# Patient Record
Sex: Female | Born: 1990 | Hispanic: Yes | Marital: Married | Smoking: Never smoker
Health system: Southern US, Community
[De-identification: ages and names within clinical notes are randomized; demographics above are authoritative.]

## PROBLEM LIST (undated history)

## (undated) DIAGNOSIS — R519 Headache, unspecified: Secondary | ICD-10-CM

## (undated) DIAGNOSIS — O139 Gestational [pregnancy-induced] hypertension without significant proteinuria, unspecified trimester: Secondary | ICD-10-CM

## (undated) DIAGNOSIS — I959 Hypotension, unspecified: Secondary | ICD-10-CM

## (undated) DIAGNOSIS — N83209 Unspecified ovarian cyst, unspecified side: Secondary | ICD-10-CM

## (undated) DIAGNOSIS — F32A Depression, unspecified: Secondary | ICD-10-CM

## (undated) DIAGNOSIS — N390 Urinary tract infection, site not specified: Secondary | ICD-10-CM

## (undated) HISTORY — PX: NO PAST SURGERIES: SHX2092

---

## 2021-03-02 NOTE — L&D Delivery Note (Signed)
OB/[facility]N Faculty [facility] Delivery Note  Terri Kane is a 31 y.o. [id number] s/p VD at [redacted]w[redacted]d. She was admitted for SROM.   ROM: 5h 82m with clear fluid GBS Status: Positive Maximum Maternal Temperature: 98.6  Labor Progress: Patient presented in active labor and received ampicillin for GBS prophylaxis. 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 She progressed to complete after receiving epidural.  Delivery Date/Time: Sept 23, 2023 at 1702  Delivery: Called to room and patient was complete and pushing. Head delivered in ROA with restitution to LOT. No nuchal cord present. Shoulder and body delivered in usual fashion. Infant with spontaneous cry, placed on mother's abdomen, dried and stimulated. Cord clamped x 2 after 1-minute delay, and cut by FOB. Cord blood drawn. Placenta delivered spontaneously with gentle cord traction. Fundus firm with massage and Pitocin. Labia, perineum, vagina, and cervix inspected with hemostatic abrasion noted.  Placenta: Intact, Shultz, Disposal Complications: None Lacerations: Perineal Abrasion-Hemostatic EBL: 158 Analgesia: Epidural  Postpartum Planning -Message sent for PPV -Nexplanon Tray Ordered -SW Consult  Infant: Female-[name]  APGARs 9, 10  [id number] Cooke, [place] Terri Kane, CNM  11/22/2021 5:51 PM

## 2021-05-02 ENCOUNTER — Encounter (HOSPITAL_COMMUNITY): Payer: Self-pay | Admitting: *Deleted

## 2021-05-02 ENCOUNTER — Inpatient Hospital Stay (HOSPITAL_COMMUNITY)
Admission: AD | Admit: 2021-05-02 | Discharge: 2021-05-02 | Disposition: A | Discharge status: Home / Self Care | Payer: Self-pay | Attending: Obstetrics & Gynecology | Admitting: Obstetrics & Gynecology

## 2021-05-02 ENCOUNTER — Inpatient Hospital Stay (HOSPITAL_COMMUNITY): Payer: Self-pay

## 2021-05-02 DIAGNOSIS — N83201 Unspecified ovarian cyst, right side: Secondary | ICD-10-CM | POA: Insufficient documentation

## 2021-05-02 DIAGNOSIS — O3680X0 Pregnancy with inconclusive fetal viability, not applicable or unspecified: Secondary | ICD-10-CM | POA: Insufficient documentation

## 2021-05-02 DIAGNOSIS — O3481 Maternal care for other abnormalities of pelvic organs, first trimester: Secondary | ICD-10-CM | POA: Insufficient documentation

## 2021-05-02 DIAGNOSIS — Z3A08 8 weeks gestation of pregnancy: Secondary | ICD-10-CM | POA: Insufficient documentation

## 2021-05-02 DIAGNOSIS — O219 Vomiting of pregnancy, unspecified: Secondary | ICD-10-CM | POA: Insufficient documentation

## 2021-05-02 DIAGNOSIS — R109 Unspecified abdominal pain: Secondary | ICD-10-CM | POA: Insufficient documentation

## 2021-05-02 DIAGNOSIS — Z3490 Encounter for supervision of normal pregnancy, unspecified, unspecified trimester: Secondary | ICD-10-CM

## 2021-05-02 DIAGNOSIS — O26891 Other specified pregnancy related conditions, first trimester: Secondary | ICD-10-CM | POA: Insufficient documentation

## 2021-05-02 DIAGNOSIS — O26899 Other specified pregnancy related conditions, unspecified trimester: Secondary | ICD-10-CM

## 2021-05-02 DIAGNOSIS — Z6740 Type O blood, Rh positive: Secondary | ICD-10-CM | POA: Insufficient documentation

## 2021-05-02 HISTORY — DX: Hypotension, unspecified: I95.9

## 2021-05-02 LAB — URINALYSIS, ROUTINE W REFLEX MICROSCOPIC
Bilirubin Urine: NEGATIVE
Glucose, UA: NEGATIVE mg/dL
Ketones, ur: NEGATIVE mg/dL
Nitrite: NEGATIVE
Protein, ur: NEGATIVE mg/dL
Specific Gravity, Urine: 1.02 (ref 1.005–1.030)
pH: 7 (ref 5.0–8.0)

## 2021-05-02 LAB — COMPREHENSIVE METABOLIC PANEL
ALT: 19 U/L (ref 0–44)
AST: 15 U/L (ref 15–41)
Albumin: 3.8 g/dL (ref 3.5–5.0)
Alkaline Phosphatase: 41 U/L (ref 38–126)
Anion gap: 7 (ref 5–15)
BUN: 9 mg/dL (ref 6–20)
CO2: 23 mmol/L (ref 22–32)
Calcium: 8.9 mg/dL (ref 8.9–10.3)
Chloride: 106 mmol/L (ref 98–111)
Creatinine, Ser: 0.6 mg/dL (ref 0.44–1.00)
GFR, Estimated: >60 mL/min (ref >60)
Glucose, Bld: 81 mg/dL (ref 70–99)
Potassium: 4 mmol/L (ref 3.5–5.1)
Sodium: 136 mmol/L (ref 135–145)
Total Bilirubin: 0.3 mg/dL (ref 0.3–1.2)
Total Protein: 6.3 g/dL — ABNORMAL LOW (ref 6.5–8.1)

## 2021-05-02 LAB — WET PREP, GENITAL
Clue Cells Wet Prep HPF POC: NONE SEEN
Sperm: NONE SEEN
Trich, Wet Prep: NONE SEEN
WBC, Wet Prep HPF POC: >=10 — AB (ref <10)
Yeast Wet Prep HPF POC: NONE SEEN

## 2021-05-02 LAB — CBC
HCT: 36.7 % (ref 36.0–46.0)
Hemoglobin: 12.3 g/dL (ref 12.0–15.0)
MCH: 30.7 pg (ref 26.0–34.0)
MCHC: 33.5 g/dL (ref 30.0–36.0)
MCV: 91.5 fL (ref 80.0–100.0)
Platelets: 240 10*3/uL (ref 150–400)
RBC: 4.01 MIL/uL (ref 3.87–5.11)
RDW: 11.8 % (ref 11.5–15.5)
WBC: 8.1 10*3/uL (ref 4.0–10.5)
nRBC: 0 % (ref 0.0–0.2)

## 2021-05-02 LAB — ABO/RH: ABO/RH(D): O POS

## 2021-05-02 LAB — PREGNANCY, URINE: Preg Test, Ur: POSITIVE

## 2021-05-02 LAB — HCG, QUANTITATIVE, PREGNANCY: hCG, Beta Chain, Quant, S: 325320 m[IU]/mL — ABNORMAL HIGH (ref <5)

## 2021-05-02 MED ORDER — ONDANSETRON HCL 4 MG/2ML IJ SOLN
4.0000 mg | Freq: Once | INTRAMUSCULAR | Status: AC
  Administered 2021-05-02: 4 mg via INTRAVENOUS
  Filled 2021-05-02: qty 2

## 2021-05-02 MED ORDER — LACTATED RINGERS IV BOLUS
1000.0000 mL | Freq: Once | INTRAVENOUS | Status: AC
  Administered 2021-05-02: 1000 mL via INTRAVENOUS

## 2021-05-02 MED ORDER — PROMETHAZINE HCL 12.5 MG PO TABS
12.5000 mg | ORAL_TABLET | Freq: Four times a day (QID) | ORAL | 0 refills | Status: DC | PRN
Start: 2021-05-02 — End: 2021-05-05

## 2021-05-02 NOTE — Discharge Instructions (Signed)

## 2021-05-02 NOTE — MAU Note (Signed)
.  Terri Kane is a 31 y.o. at Unknown here in MAU reporting: that she has lower abd pain that gets worse at night . Pt has had n/v also off and on x 3 week. Able to keep some food down but most everything comes back up . Denies any vag bleeding or discharge.  ?LMP: 03/07/2021. Pregnancy confirmation from [facility] ?Onset of complaint: 3 weeks ago. ?Pain score: 10 ?BP (!) 107/53   Pulse 89   Temp 98.5 ?F (36.9 ?C)   Resp 18   Ht 5' (1.524 m)   Wt 41.6 kg   LMP 03/07/2021   BMI 17.93 kg/m?  ? ?FHT: ?Lab orders placed from triage:U/A    ?

## 2021-05-02 NOTE — MAU Provider Note (Signed)
?History  ?  ? ?CSN: [id number] ? ?Arrival date and time: 05/02/21 1128 ? ? Event Date/Time  ? First Provider Initiated Contact with Patient 05/02/21 1338   ?  ? ?Chief Complaint  ?Patient presents with  ? Abdominal Pain  ? ?HPI ?Leighanne Inaya Kight is a 31 y.o. [id number] at [redacted]w[redacted]d who presents to MAU with chief complaint of abdominal pain. This is a new problem, onset three weeks ago. Patient's pain is bilateral, located in her lower abdomen. Pain score is 10/10. She denies aggravating or alleviating factors. She has not taken medication or tried other treatments for this complaint.  ? ?Patient also reports nausea and vomiting. These are recurrent problems, onset three weeks ago. She is unable to tolerate anything PO. She denies weakness, dizziness, syncope. She denies vaginal bleeding, dysuria, fever or recent illness. ? ?Patient plans to pursue prenatal care at [facility]. ? ?OB History   ? ? Gravida  ?4  ? Para  ?2  ? Term  ?2  ? Preterm  ?   ? AB  ?1  ? Living  ?2  ?  ? ? SAB  ?1  ? IAB  ?   ? Ectopic  ?   ? Multiple  ?   ? Live Births  ?2  ?   ?  ?  ? ? ?Past Medical History:  ?Diagnosis Date  ? Hypotension   ? ? ?Past Surgical History:  ?Procedure Laterality Date  ? NO PAST SURGERIES    ? ? ?No family history on file. ? ?Social History  ? ?Tobacco Use  ? Smoking status: Former  ?  Types: Cigarettes  ? Smokeless tobacco: Never  ?Vaping Use  ? Vaping Use: Never used  ?Substance Use Topics  ? Alcohol use: Not Currently  ? Drug use: Never  ? ? ?Allergies: No Known Allergies ? ?No medications prior to admission.  ? ? ?Review of Systems  ?Gastrointestinal:  Positive for abdominal pain, nausea and vomiting.  ?All other systems reviewed and are negative. ?Physical Exam  ? ?Blood pressure (!) 98/56, pulse 66, temperature 98.5 ?F (36.9 ?C), resp. rate 16, height 5' (1.524 m), weight 41.6 kg, last menstrual period 03/07/2021, SpO2 100 %. ? ?Physical Exam ?Vitals and nursing note reviewed. Exam conducted with a chaperone  present.  ?Constitutional:   ?   Appearance: She is well-developed.  ?Cardiovascular:  ?   Rate and Rhythm: Normal rate and regular rhythm.  ?   Heart sounds: Normal heart sounds.  ?Pulmonary:  ?   Effort: Pulmonary effort is normal.  ?   Breath sounds: Normal breath sounds.  ?Abdominal:  ?   General: Abdomen is flat. Bowel sounds are normal.  ?   Palpations: Abdomen is soft.  ?   Tenderness: There is no abdominal tenderness. There is no right CVA tenderness or left CVA tenderness.  ?Neurological:  ?   Mental Status: She is alert and oriented to person, place, and time.  ?Psychiatric:     ?   Mood and Affect: Mood normal.     ?   Behavior: Behavior normal.  ? ? ?MAU Course  ?Procedures ? ?MDM ?Orders Placed This Encounter  ?Procedures  ? Wet prep, genital  ? US OB LESS THAN 14 WEEKS WITH OB TRANSVAGINAL  ? Urinalysis, Routine w reflex microscopic Urine, Clean Catch  ? CBC  ? hCG, quantitative, pregnancy  ? Comprehensive metabolic panel  ? Nursing communication  ? ABO/Rh  ? Insert peripheral IV  ?  Discharge patient  ? ?Patient Vitals for the past 24 hrs: ? BP Temp Pulse Resp SpO2 Height Weight  ?05/02/21 1356 -- -- -- 16 100 % -- --  ?05/02/21 1352 (!) 98/56 -- 66 -- -- -- --  ?05/02/21 1151 (!) 107/53 98.5 ?F (36.9 ?C) 89 18 -- 5' (1.524 m) 41.6 kg  ? ?Results for orders placed or performed during the hospital encounter of 05/02/21 (from the past 24 hour(s))  ?CBC     Status: None  ? Collection Time: 05/02/21 12:08 PM  ?Result Value Ref Range  ? WBC 8.1 4.0 - 10.5 K/uL  ? RBC 4.01 3.87 - 5.11 MIL/uL  ? Hemoglobin 12.3 12.0 - 15.0 g/dL  ? HCT 36.7 36.0 - 46.0 %  ? MCV 91.5 80.0 - 100.0 fL  ? MCH 30.7 26.0 - 34.0 pg  ? MCHC 33.5 30.0 - 36.0 g/dL  ? RDW 11.8 11.5 - 15.5 %  ? Platelets 240 150 - 400 K/uL  ? nRBC 0.0 0.0 - 0.2 %  ?Comprehensive metabolic panel     Status: Abnormal  ? Collection Time: 05/02/21 12:08 PM  ?Result Value Ref Range  ? Sodium 136 135 - 145 mmol/L  ? Potassium 4.0 3.5 - 5.1 mmol/L  ? Chloride  106 98 - 111 mmol/L  ? CO2 23 22 - 32 mmol/L  ? Glucose, Bld 81 70 - 99 mg/dL  ? BUN 9 6 - 20 mg/dL  ? Creatinine, Ser 0.60 0.44 - 1.00 mg/dL  ? Calcium 8.9 8.9 - 10.3 mg/dL  ? Total Protein 6.3 (L) 6.5 - 8.1 g/dL  ? Albumin 3.8 3.5 - 5.0 g/dL  ? AST 15 15 - 41 U/L  ? ALT 19 0 - 44 U/L  ? Alkaline Phosphatase 41 38 - 126 U/L  ? Total Bilirubin 0.3 0.3 - 1.2 mg/dL  ? GFR, Estimated >60 >60 mL/min  ? Anion gap 7 5 - 15  ?ABO/Rh     Status: None  ? Collection Time: 05/02/21 12:08 PM  ?Result Value Ref Range  ? ABO/RH(D) O POS   ? No rh immune globuloin    ?  NOT A RH IMMUNE GLOBULIN CANDIDATE, PT RH POSITIVE ?Performed at [facility], [address] [place], [place] [address] ?  ?Wet prep, genital     Status: Abnormal  ? Collection Time: 05/02/21 12:08 PM  ? Specimen: Vaginal  ?Result Value Ref Range  ? Yeast Wet Prep HPF POC NONE SEEN NONE SEEN  ? Trich, Wet Prep NONE SEEN NONE SEEN  ? Clue Cells Wet Prep HPF POC NONE SEEN NONE SEEN  ? WBC, Wet Prep HPF POC >=10 (A) <10  ? Sperm NONE SEEN   ?Urinalysis, Routine w reflex microscopic Urine, Clean Catch     Status: Abnormal  ? Collection Time: 05/02/21 12:25 PM  ?Result Value Ref Range  ? Color, Urine YELLOW YELLOW  ? APPearance CLOUDY (A) CLEAR  ? Specific Gravity, Urine 1.020 1.005 - 1.030  ? pH 7.0 5.0 - 8.0  ? Glucose, UA NEGATIVE NEGATIVE mg/dL  ? Hgb urine dipstick SMALL (A) NEGATIVE  ? Bilirubin Urine NEGATIVE NEGATIVE  ? Ketones, ur NEGATIVE NEGATIVE mg/dL  ? Protein, ur NEGATIVE NEGATIVE mg/dL  ? Nitrite NEGATIVE NEGATIVE  ? Leukocytes,Ua TRACE (A) NEGATIVE  ? RBC / HPF 6-10 0 - 5 RBC/hpf  ? WBC, UA 0-5 0 - 5 WBC/hpf  ? Bacteria, UA FEW (A) NONE SEEN  ? Squamous Epithelial /  LPF 0-5 0 - 5  ? Mucus PRESENT   ? Amorphous Crystal PRESENT   ? ?US OB LESS THAN 14 WEEKS WITH OB TRANSVAGINAL ? ?Result Date: 05/02/2021 ?CLINICAL DATA:  31 year old pregnant female presents with cramping. Quantitative beta HCG pending. EDC by LMP: 12/12/2021, projecting to an  expected gestational age of [redacted] weeks 0 days. EXAM: OBSTETRIC <14 WK [place] AND TRANSVAGINAL OB US TECHNIQUE: Both transabdominal and transvaginal ultrasound examinations were performed for complete evaluation of the gestation as well as the maternal uterus, adnexal regions, and pelvic cul-de-sac. Transvaginal technique was performed to assess early pregnancy. COMPARISON:  None. FINDINGS: Intrauterine gestational sac: Single Yolk sac:  Visualized. Embryo:  Visualized. Cardiac Activity: Visualized. Heart Rate: 173 bpm CRL:  20.7 mm   8 w   4 d                  [place] EDC: 12/08/2021 Subchorionic hemorrhage:  No convincing perigestational bleed. Maternal uterus/adnexae: Anteverted uterus. No uterine fibroids. Right ovary measures 4.2 x 2.6 x 3.5 cm and contains a corpus luteum and a separate 2.9 x 2.0 x 2.1 cm cyst with no internal vascularity and mild heterogeneous internal echoes, compatible with a hemorrhagic cyst. Left ovary measures 1.5 x 1.2 x 1.9 cm. No suspicious ovarian or adnexal masses. No abnormal free fluid in the pelvis. IMPRESSION: 1. Single living intrauterine gestation at 8 weeks 4 days by crown-rump length, concordant with provided menstrual dating. 2. No acute first-trimester gestational abnormality. 3. Right ovarian 2.9 cm hemorrhagic cyst. Electronically Signed   By: Delbert Phenix M.D.   On: 05/02/2021 13:34   ? ?Meds ordered this encounter  ?Medications  ? lactated ringers bolus 1,000 mL  ? ondansetron (ZOFRAN) injection 4 mg  ? promethazine (PHENERGAN) 12.5 MG tablet  ?  Sig: Take 1 tablet (12.5 mg total) by mouth every 6 (six) hours as needed for nausea or vomiting.  ?  Dispense:  30 tablet  ?  Refill:  0  ?  Order Specific Question:   Supervising Provider  ?  AnswerMyna Hidalgo [[id number]]  ? ?Assessment and Plan  ?--31 y.o. [id number] at [redacted]w[redacted]d with confirmed IUP measuring 8w 4d ?--Vomiting without ketonuria ?--No episodes of vomiting during evaluation in MAU ?--Tolerating PO prior to  discharge ?--Discharge home in stable condition ? ?F/U ?--Patient to schedule care with [facility] ? ?Calvert Cantor, MSA, MSN, CNM ?05/02/2021, 2:12 PM  ?

## 2021-05-04 LAB — CULTURE, OB URINE: Culture: 1000 — AB

## 2021-05-05 ENCOUNTER — Inpatient Hospital Stay (HOSPITAL_COMMUNITY)
Admission: AD | Admit: 2021-05-05 | Discharge: 2021-05-05 | Disposition: A | Discharge status: Home / Self Care | Payer: Self-pay | Attending: Family Medicine | Admitting: Family Medicine

## 2021-05-05 ENCOUNTER — Encounter: Payer: Self-pay | Admitting: Family Medicine

## 2021-05-05 ENCOUNTER — Encounter (HOSPITAL_COMMUNITY): Payer: Self-pay | Admitting: Family Medicine

## 2021-05-05 ENCOUNTER — Other Ambulatory Visit: Payer: Self-pay

## 2021-05-05 DIAGNOSIS — R1032 Left lower quadrant pain: Secondary | ICD-10-CM | POA: Insufficient documentation

## 2021-05-05 DIAGNOSIS — O26893 Other specified pregnancy related conditions, third trimester: Secondary | ICD-10-CM | POA: Insufficient documentation

## 2021-05-05 DIAGNOSIS — Z3A08 8 weeks gestation of pregnancy: Secondary | ICD-10-CM

## 2021-05-05 DIAGNOSIS — K920 Hematemesis: Secondary | ICD-10-CM | POA: Insufficient documentation

## 2021-05-05 DIAGNOSIS — O212 Late vomiting of pregnancy: Secondary | ICD-10-CM | POA: Insufficient documentation

## 2021-05-05 DIAGNOSIS — Z3A38 38 weeks gestation of pregnancy: Secondary | ICD-10-CM | POA: Insufficient documentation

## 2021-05-05 DIAGNOSIS — O99891 Other specified diseases and conditions complicating pregnancy: Secondary | ICD-10-CM | POA: Insufficient documentation

## 2021-05-05 DIAGNOSIS — R8271 Bacteriuria: Secondary | ICD-10-CM | POA: Insufficient documentation

## 2021-05-05 DIAGNOSIS — R112 Nausea with vomiting, unspecified: Secondary | ICD-10-CM

## 2021-05-05 LAB — GC/CHLAMYDIA PROBE AMP (~~LOC~~) NOT AT ARMC
Chlamydia: NEGATIVE
Comment: NEGATIVE
Comment: NORMAL
Neisseria Gonorrhea: NEGATIVE

## 2021-05-05 LAB — URINALYSIS, ROUTINE W REFLEX MICROSCOPIC
Bilirubin Urine: NEGATIVE
Glucose, UA: NEGATIVE mg/dL
Hgb urine dipstick: NEGATIVE
Ketones, ur: NEGATIVE mg/dL
Leukocytes,Ua: NEGATIVE
Nitrite: NEGATIVE
Protein, ur: NEGATIVE mg/dL
Specific Gravity, Urine: 1.021 (ref 1.005–1.030)
pH: 9 — ABNORMAL HIGH (ref 5.0–8.0)

## 2021-05-05 LAB — BASIC METABOLIC PANEL
Anion gap: 8 (ref 5–15)
BUN: 13 mg/dL (ref 6–20)
CO2: 24 mmol/L (ref 22–32)
Calcium: 8.9 mg/dL (ref 8.9–10.3)
Chloride: 103 mmol/L (ref 98–111)
Creatinine, Ser: 0.7 mg/dL (ref 0.44–1.00)
GFR, Estimated: >60 mL/min (ref >60)
Glucose, Bld: 93 mg/dL (ref 70–99)
Potassium: 3.8 mmol/L (ref 3.5–5.1)
Sodium: 135 mmol/L (ref 135–145)

## 2021-05-05 MED ORDER — FAMOTIDINE 20 MG PO TABS: 20.0000 mg | ORAL_TABLET | Freq: Two times a day (BID) | ORAL | 3 refills | Status: DC

## 2021-05-05 MED ORDER — PROMETHAZINE HCL 12.5 MG PO TABS: 12.5000 mg | ORAL_TABLET | Freq: Four times a day (QID) | ORAL | 3 refills | Status: DC | PRN

## 2021-05-05 MED ORDER — LACTATED RINGERS IV BOLUS
1000.0000 mL | Freq: Once | INTRAVENOUS | Status: AC
  Administered 2021-05-05: 1000 mL via INTRAVENOUS

## 2021-05-05 MED ORDER — FAMOTIDINE IN NACL 20-0.9 MG/50ML-% IV SOLN
20.0000 mg | Freq: Once | INTRAVENOUS | Status: AC
  Administered 2021-05-05: 20 mg via INTRAVENOUS
  Filled 2021-05-05: qty 50

## 2021-05-05 MED ORDER — ONDANSETRON HCL 4 MG/2ML IJ SOLN
4.0000 mg | Freq: Once | INTRAMUSCULAR | Status: AC
  Administered 2021-05-05: 4 mg via INTRAVENOUS
  Filled 2021-05-05: qty 2

## 2021-05-05 NOTE — MAU Note (Signed)
Terri Kane is a 31 y.o. at [redacted]w[redacted]d here in MAU reporting: abdominal pain and vomiting blood.   ? ?LMP: 03/07/2021 ? ?Onset of complaint: abdominal pain 3 weeks & vomiting blood began last night ? ?Pain score: 10 ?   ? ?Lab orders placed from triage:   U/A ?

## 2021-05-05 NOTE — MAU Provider Note (Signed)
?History  ?  ? ?CSN: [id number] ? ?Arrival date and time: 05/05/21 1102 ? ? ? ?Chief Complaint  ?Patient presents with  ? Abdominal Pain  ? Vomiting Blood  ? ?HPI ?This is a 31 year old G4 P2-0-1-2 at 38 weeks and 3 days who presents with left-sided abdominal pain, nausea, vomiting.  Emesis occurs with oral food and liquid intake.  Emesis described as stomach contents with blood.  She was evaluated last week and diagnosed with an IUP.  She was given IV fluids and an antiemetic.  Records do show that she was prescribed an antiemetic, but she did not pick up the medication.  No palliating or provoking factors. ? ?OB History   ? ? Gravida  ?4  ? Para  ?2  ? Term  ?2  ? Preterm  ?   ? AB  ?1  ? Living  ?2  ?  ? ? SAB  ?1  ? IAB  ?   ? Ectopic  ?   ? Multiple  ?   ? Live Births  ?2  ?   ?  ?  ? ? ?Past Medical History:  ?Diagnosis Date  ? Hypotension   ? ? ?Past Surgical History:  ?Procedure Laterality Date  ? NO PAST SURGERIES    ? ? ?No family history on file. ? ?Social History  ? ?Tobacco Use  ? Smoking status: Former  ?  Types: Cigarettes  ? Smokeless tobacco: Never  ?Vaping Use  ? Vaping Use: Never used  ?Substance Use Topics  ? Alcohol use: Not Currently  ? Drug use: Never  ? ? ?Allergies: No Known Allergies ? ?Medications Prior to Admission  ?Medication Sig Dispense Refill Last Dose  ? promethazine (PHENERGAN) 12.5 MG tablet Take 1 tablet (12.5 mg total) by mouth every 6 (six) hours as needed for nausea or vomiting. 30 tablet 0   ? ? ?Review of Systems ?Physical Exam  ? ?Blood pressure 103/65, pulse 96, temperature 98.2 ?F (36.8 ?C), temperature source Oral, resp. rate 18, height 5' (1.524 m), weight 41.9 kg, last menstrual period 03/07/2021, SpO2 98 %. ? ?Physical Exam ?Vitals reviewed.  ?Constitutional:   ?   Appearance: She is well-developed.  ?HENT:  ?   Head: Normocephalic and atraumatic.  ?Cardiovascular:  ?   Rate and Rhythm: Normal rate.  ?Pulmonary:  ?   Effort: Pulmonary effort is normal.  ?Abdominal:  ?    General: Abdomen is flat.  ?   Palpations: Abdomen is soft.  ?   Tenderness: There is abdominal tenderness in the left lower quadrant. There is no guarding or rebound.  ?Skin: ?   General: Skin is warm and dry.  ?   Capillary Refill: Capillary refill takes less than 2 seconds.  ?Neurological:  ?   General: No focal deficit present.  ?   Mental Status: She is alert and oriented to person, place, and time.  ? ?Results for orders placed or performed during the hospital encounter of 05/05/21 (from the past 24 hour(s))  ?Urinalysis, Routine w reflex microscopic Urine, Clean Catch     Status: Abnormal  ? Collection Time: 05/05/21 12:42 PM  ?Result Value Ref Range  ? Color, Urine AMBER (A) YELLOW  ? APPearance TURBID (A) CLEAR  ? Specific Gravity, Urine 1.021 1.005 - 1.030  ? pH 9.0 (H) 5.0 - 8.0  ? Glucose, UA NEGATIVE NEGATIVE mg/dL  ? Hgb urine dipstick NEGATIVE NEGATIVE  ? Bilirubin Urine NEGATIVE NEGATIVE  ? Ketones, ur  NEGATIVE NEGATIVE mg/dL  ? Protein, ur NEGATIVE NEGATIVE mg/dL  ? Nitrite NEGATIVE NEGATIVE  ? Leukocytes,Ua NEGATIVE NEGATIVE  ? RBC / HPF 0-5 0 - 5 RBC/hpf  ? WBC, UA 6-10 0 - 5 WBC/hpf  ? Bacteria, UA RARE (A) NONE SEEN  ? Squamous Epithelial / LPF 0-5 0 - 5  ?Basic metabolic panel     Status: None  ? Collection Time: 05/05/21  1:49 PM  ?Result Value Ref Range  ? Sodium 135 135 - 145 mmol/L  ? Potassium 3.8 3.5 - 5.1 mmol/L  ? Chloride 103 98 - 111 mmol/L  ? CO2 24 22 - 32 mmol/L  ? Glucose, Bld 93 70 - 99 mg/dL  ? BUN 13 6 - 20 mg/dL  ? Creatinine, Ser 0.70 0.44 - 1.00 mg/dL  ? Calcium 8.9 8.9 - 10.3 mg/dL  ? GFR, Estimated >60 >60 mL/min  ? Anion gap 8 5 - 15  ? ? ? ?MAU Course  ?Procedures ? ?MDM ?IVF given with zofran and pepcid. Patient improved. Tolerated PO liquid ? ?Assessment and Plan  ? ?1. [redacted] weeks gestation of pregnancy   ?2. Nausea and vomiting, unspecified vomiting type   ? ?Discharge to home with Pepcid and phenergan. Return precautions given. ? ?Levie Heritage ?05/05/2021, 1:42 PM  ?

## 2021-05-13 ENCOUNTER — Other Ambulatory Visit: Payer: Self-pay

## 2021-05-13 ENCOUNTER — Inpatient Hospital Stay (HOSPITAL_COMMUNITY)
Admission: AD | Admit: 2021-05-13 | Discharge: 2021-05-13 | Disposition: A | Discharge status: Home / Self Care | Payer: Self-pay | Attending: Obstetrics and Gynecology | Admitting: Obstetrics and Gynecology

## 2021-05-13 ENCOUNTER — Encounter (HOSPITAL_COMMUNITY): Payer: Self-pay | Admitting: Obstetrics and Gynecology

## 2021-05-13 DIAGNOSIS — Z3A09 9 weeks gestation of pregnancy: Secondary | ICD-10-CM

## 2021-05-13 DIAGNOSIS — R12 Heartburn: Secondary | ICD-10-CM

## 2021-05-13 DIAGNOSIS — O26891 Other specified pregnancy related conditions, first trimester: Secondary | ICD-10-CM

## 2021-05-13 DIAGNOSIS — O219 Vomiting of pregnancy, unspecified: Secondary | ICD-10-CM

## 2021-05-13 LAB — COMPREHENSIVE METABOLIC PANEL
ALT: 21 U/L (ref 0–44)
AST: 16 U/L (ref 15–41)
Albumin: 4 g/dL (ref 3.5–5.0)
Alkaline Phosphatase: 45 U/L (ref 38–126)
Anion gap: 9 (ref 5–15)
BUN: 12 mg/dL (ref 6–20)
CO2: 24 mmol/L (ref 22–32)
Calcium: 9.2 mg/dL (ref 8.9–10.3)
Chloride: 103 mmol/L (ref 98–111)
Creatinine, Ser: 0.63 mg/dL (ref 0.44–1.00)
GFR, Estimated: >60 mL/min (ref >60)
Glucose, Bld: 94 mg/dL (ref 70–99)
Potassium: 3.6 mmol/L (ref 3.5–5.1)
Sodium: 136 mmol/L (ref 135–145)
Total Bilirubin: 0.6 mg/dL (ref 0.3–1.2)
Total Protein: 7.3 g/dL (ref 6.5–8.1)

## 2021-05-13 LAB — URINALYSIS, ROUTINE W REFLEX MICROSCOPIC
Bilirubin Urine: NEGATIVE
Glucose, UA: NEGATIVE mg/dL
Ketones, ur: 20 mg/dL — AB
Nitrite: NEGATIVE
Protein, ur: NEGATIVE mg/dL
Specific Gravity, Urine: 1.021 (ref 1.005–1.030)
pH: 7 (ref 5.0–8.0)

## 2021-05-13 LAB — CBC WITH DIFFERENTIAL/PLATELET
Abs Immature Granulocytes: 0.03 10*3/uL (ref 0.00–0.07)
Basophils Absolute: 0 10*3/uL (ref 0.0–0.1)
Basophils Relative: 0 %
Eosinophils Absolute: 0.1 10*3/uL (ref 0.0–0.5)
Eosinophils Relative: 1 %
HCT: 38.5 % (ref 36.0–46.0)
Hemoglobin: 13.5 g/dL (ref 12.0–15.0)
Immature Granulocytes: 0 %
Lymphocytes Relative: 26 %
Lymphs Abs: 2.5 10*3/uL (ref 0.7–4.0)
MCH: 31.2 pg (ref 26.0–34.0)
MCHC: 35.1 g/dL (ref 30.0–36.0)
MCV: 88.9 fL (ref 80.0–100.0)
Monocytes Absolute: 0.4 10*3/uL (ref 0.1–1.0)
Monocytes Relative: 5 %
Neutro Abs: 6.4 10*3/uL (ref 1.7–7.7)
Neutrophils Relative %: 68 %
Platelets: 312 10*3/uL (ref 150–400)
RBC: 4.33 MIL/uL (ref 3.87–5.11)
RDW: 11.6 % (ref 11.5–15.5)
WBC: 9.5 10*3/uL (ref 4.0–10.5)
nRBC: 0 % (ref 0.0–0.2)

## 2021-05-13 LAB — AMYLASE: Amylase: 129 U/L — ABNORMAL HIGH (ref 28–100)

## 2021-05-13 LAB — LIPASE, BLOOD: Lipase: 34 U/L (ref 11–51)

## 2021-05-13 MED ORDER — PROMETHAZINE HCL 25 MG PO TABS: 25.0000 mg | ORAL_TABLET | Freq: Four times a day (QID) | ORAL | 1 refills | Status: DC | PRN

## 2021-05-13 MED ORDER — PROMETHAZINE HCL 25 MG/ML IJ SOLN
25.0000 mg | Freq: Once | INTRAVENOUS | Status: AC
  Administered 2021-05-13: 25 mg via INTRAVENOUS
  Filled 2021-05-13: qty 1

## 2021-05-13 MED ORDER — FAMOTIDINE IN NACL 20-0.9 MG/50ML-% IV SOLN
20.0000 mg | Freq: Once | INTRAVENOUS | Status: AC
  Administered 2021-05-13: 20 mg via INTRAVENOUS
  Filled 2021-05-13: qty 50

## 2021-05-13 NOTE — MAU Note (Signed)
...  Terri Kane is a 31 y.o. at [redacted]w[redacted]d here in MAU reporting: N/V throughout her entire pregnancy. She states she has also been experiencing upper abdominal pain and bilateral back pain for three weeks now. No VB or LOF.  ? ?Unable to keep any medications down. Last took at 0900 this morning prior to eating. ? ?Last PO: ?Last attempt at eating this morning at 1000 ?Last drink at 1600 ? ?Pain score:  ?10/10 upper abdomen ?10/10 lower back ? ?Lab orders placed from triage: UA ? ?

## 2021-05-13 NOTE — MAU Provider Note (Signed)
?History  ?  ? ?[id number] ? ?Arrival date and time: 05/13/21 1519 ?  ? ?Chief Complaint  ?Patient presents with  ? Nausea  ? Abdominal Pain  ? Back Pain  ? Emesis  ? ? ? ?HPI ?Terri Kane is a 32 y.o. at [redacted]w[redacted]d by LMP who presents for abdominal pain, heartburn, & nausea/vomiting. ?Reports vomiting daily since last week. States she has vomited countless times today and is unable to keep anything down. Was prescribed pepcid & phenergan but is only aware of the phenergan which she last took this morning at 9 am. Reports some lower abdominal cramping. Denies fever, diarrhea, or vaginal bleeding.   ? ?OB History   ? ? Gravida  ?4  ? Para  ?2  ? Term  ?2  ? Preterm  ?   ? AB  ?1  ? Living  ?2  ?  ? ? SAB  ?1  ? IAB  ?   ? Ectopic  ?   ? Multiple  ?   ? Live Births  ?2  ?   ?  ?  ? ? ?Past Medical History:  ?Diagnosis Date  ? Hypotension   ? ? ?Past Surgical History:  ?Procedure Laterality Date  ? NO PAST SURGERIES    ? ? ?History reviewed. No pertinent family history. ? ?No Known Allergies ? ?No current facility-administered medications on file prior to encounter.  ? ?Current Outpatient Medications on File Prior to Encounter  ?Medication Sig Dispense Refill  ? famotidine (PEPCID) 20 MG tablet Take 1 tablet (20 mg total) by mouth 2 (two) times daily. 60 tablet 3  ? promethazine (PHENERGAN) 12.5 MG tablet Take 1 tablet (12.5 mg total) by mouth every 6 (six) hours as needed for nausea or vomiting. 30 tablet 3  ? ? ? ?ROS ?Pertinent positives and negative per HPI, all others reviewed and negative ? ?Physical Exam  ? ?BP 94/68 (BP Location: Left Arm)   Pulse 81   Temp 98.1 ?F (36.7 ?C) (Oral)   Resp 14   Ht 5' (1.524 m)   Wt 40.8 kg   LMP 03/07/2021   SpO2 100%   BMI 17.58 kg/m?  ? ?Patient Vitals for the past 24 hrs: ? BP Temp Temp src Pulse Resp SpO2 Height Weight  ?05/13/21 2258 94/68 -- -- 81 -- -- -- --  ?05/13/21 2228 (!) 95/57 -- -- 75 -- -- -- --  ?05/13/21 1647 103/63 98.1 ?F (36.7 ?C) Oral 83 14  100 % 5' (1.524 m) 40.8 kg  ? ? ?Physical Exam ?Vitals and nursing note reviewed.  ?Constitutional:   ?   General: She is not in acute distress. ?   Appearance: She is well-developed. She is not ill-appearing.  ?HENT:  ?   Head: Normocephalic and atraumatic.  ?Pulmonary:  ?   Effort: Pulmonary effort is normal. No respiratory distress.  ?Abdominal:  ?   General: Abdomen is flat.  ?   Palpations: Abdomen is soft.  ?   Tenderness: There is no abdominal tenderness.  ?Skin: ?   General: Skin is warm and dry.  ?Neurological:  ?   Mental Status: She is alert.  ?  ?Bedside Ultrasound ?Pt informed that the ultrasound is considered a limited OB ultrasound and is not intended to be a complete ultrasound exam.  Patient also informed that the ultrasound is not being completed with the intent of assessing for fetal or placental anomalies or any pelvic abnormalities.  Explained that the  purpose of today?s ultrasound is to assess for  viability.  Patient acknowledges the purpose of the exam and the limitations of the study.   ? ? ?My interpretation: active IUP with fetal heart rate 176 bpm ? ?Labs ?Results for orders placed or performed during the hospital encounter of 05/13/21 (from the past 24 hour(s))  ?CBC with Differential/Platelet     Status: None  ? Collection Time: 05/13/21  5:33 PM  ?Result Value Ref Range  ? WBC 9.5 4.0 - 10.5 K/uL  ? RBC 4.33 3.87 - 5.11 MIL/uL  ? Hemoglobin 13.5 12.0 - 15.0 g/dL  ? HCT 38.5 36.0 - 46.0 %  ? MCV 88.9 80.0 - 100.0 fL  ? MCH 31.2 26.0 - 34.0 pg  ? MCHC 35.1 30.0 - 36.0 g/dL  ? RDW 11.6 11.5 - 15.5 %  ? Platelets 312 150 - 400 K/uL  ? nRBC 0.0 0.0 - 0.2 %  ? Neutrophils Relative % 68 %  ? Neutro Abs 6.4 1.7 - 7.7 K/uL  ? Lymphocytes Relative 26 %  ? Lymphs Abs 2.5 0.7 - 4.0 K/uL  ? Monocytes Relative 5 %  ? Monocytes Absolute 0.4 0.1 - 1.0 K/uL  ? Eosinophils Relative 1 %  ? Eosinophils Absolute 0.1 0.0 - 0.5 K/uL  ? Basophils Relative 0 %  ? Basophils Absolute 0.0 0.0 - 0.1 K/uL  ?  Immature Granulocytes 0 %  ? Abs Immature Granulocytes 0.03 0.00 - 0.07 K/uL  ?Comprehensive metabolic panel     Status: None  ? Collection Time: 05/13/21  5:33 PM  ?Result Value Ref Range  ? Sodium 136 135 - 145 mmol/L  ? Potassium 3.6 3.5 - 5.1 mmol/L  ? Chloride 103 98 - 111 mmol/L  ? CO2 24 22 - 32 mmol/L  ? Glucose, Bld 94 70 - 99 mg/dL  ? BUN 12 6 - 20 mg/dL  ? Creatinine, Ser 0.63 0.44 - 1.00 mg/dL  ? Calcium 9.2 8.9 - 10.3 mg/dL  ? Total Protein 7.3 6.5 - 8.1 g/dL  ? Albumin 4.0 3.5 - 5.0 g/dL  ? AST 16 15 - 41 U/L  ? ALT 21 0 - 44 U/L  ? Alkaline Phosphatase 45 38 - 126 U/L  ? Total Bilirubin 0.6 0.3 - 1.2 mg/dL  ? GFR, Estimated >60 >60 mL/min  ? Anion gap 9 5 - 15  ?Amylase     Status: Abnormal  ? Collection Time: 05/13/21  5:33 PM  ?Result Value Ref Range  ? Amylase 129 (H) 28 - 100 U/L  ?Lipase, blood     Status: None  ? Collection Time: 05/13/21  5:33 PM  ?Result Value Ref Range  ? Lipase 34 11 - 51 U/L  ?Urinalysis, Routine w reflex microscopic Urine, Clean Catch     Status: Abnormal  ? Collection Time: 05/13/21  6:07 PM  ?Result Value Ref Range  ? Color, Urine YELLOW YELLOW  ? APPearance CLOUDY (A) CLEAR  ? Specific Gravity, Urine 1.021 1.005 - 1.030  ? pH 7.0 5.0 - 8.0  ? Glucose, UA NEGATIVE NEGATIVE mg/dL  ? Hgb urine dipstick SMALL (A) NEGATIVE  ? Bilirubin Urine NEGATIVE NEGATIVE  ? Ketones, ur 20 (A) NEGATIVE mg/dL  ? Protein, ur NEGATIVE NEGATIVE mg/dL  ? Nitrite NEGATIVE NEGATIVE  ? Leukocytes,Ua LARGE (A) NEGATIVE  ? RBC / HPF 6-10 0 - 5 RBC/hpf  ? Bacteria, UA FEW (A) NONE SEEN  ? Squamous Epithelial / LPF 11-20 0 - 5  ?  Amorphous Crystal PRESENT   ? ? ?Imaging ?No results found. ? ?MAU Course  ?Procedures ?Lab Orders    ?     Culture, OB Urine    ?     Urinalysis, Routine w reflex microscopic Urine, Clean Catch    ?     CBC with Differential/Platelet    ?     Comprehensive metabolic panel    ?     Amylase    ?     Lipase, blood    ?Meds ordered this encounter  ?Medications  ? promethazine  (PHENERGAN) 25 mg in lactated ringers 1,000 mL infusion  ? famotidine (PEPCID) IVPB 20 mg premix  ? promethazine (PHENERGAN) 25 MG tablet  ?  Sig: Take 1 tablet (25 mg total) by mouth every 6 (six) hours as needed for nausea or vomiting.  ?  Dispense:  30 tablet  ?  Refill:  1  ?  Order Specific Question:   Supervising Provider  ?  Answer:   CONSTANT, PEGGY [[id number]]  ? ?Imaging Orders  ?No imaging studies ordered today  ? ? ?MDM ?Patient presents with nausea, vomiting, & heartburn. Labs ordered & reviewed. Patient treated with IV fluids, pepcid, & phenergan. Reports improvement in symptoms & no vomiting episodes in MAU. Will increase antiemetic dose at home.  ? ?Bedside ultrasound performed for viability - see note under physical exam ? ?[facility] provided Spanish interpreter used for this encounter.  ?Assessment and Plan  ? ?1. Nausea and vomiting during pregnancy prior to [redacted] weeks gestation  ?-Increase phenergan to 25 mg Q6 hours. Place vaginally if necessary.   ?2. Heartburn during pregnancy in first trimester  ?-Start taking pepcid as previously prescribed  ?3. [redacted] weeks gestation of pregnancy   ? ? ? ?Judeth HornErin Euleta Belson, NP ?05/13/21 ?11:05 PM ? ? ?

## 2021-05-15 LAB — CULTURE, OB URINE: Special Requests: NORMAL

## 2021-05-16 ENCOUNTER — Encounter: Payer: Self-pay | Admitting: *Deleted

## 2021-05-22 ENCOUNTER — Telehealth (INDEPENDENT_AMBULATORY_CARE_PROVIDER_SITE_OTHER): Payer: Self-pay

## 2021-05-22 ENCOUNTER — Other Ambulatory Visit (HOSPITAL_COMMUNITY)
Admission: RE | Admit: 2021-05-22 | Discharge: 2021-05-22 | Disposition: A | Discharge status: Home / Self Care | Payer: Self-pay | Source: Ambulatory Visit | Attending: Family Medicine | Admitting: Family Medicine

## 2021-05-22 VITALS — Wt 92.3 lb

## 2021-05-22 DIAGNOSIS — Z3A00 Weeks of gestation of pregnancy not specified: Secondary | ICD-10-CM

## 2021-05-22 DIAGNOSIS — F32A Depression, unspecified: Secondary | ICD-10-CM

## 2021-05-22 DIAGNOSIS — Z3480 Encounter for supervision of other normal pregnancy, unspecified trimester: Secondary | ICD-10-CM | POA: Insufficient documentation

## 2021-05-22 NOTE — Progress Notes (Signed)
New OB Intake ? ?I connected with  Terri Kane on 05/22/21 at  2:15 PM EDT by In Person Visit and verified that I am speaking with the correct person using two identifiers. Nurse is located at [facility] and pt is located at [employer]. ? ?I discussed the limitations, risks, security and privacy concerns of performing an evaluation and management service by telephone and the availability of in person appointments. I also discussed with the patient that there may be a patient responsible charge related to this service. The patient expressed understanding and agreed to proceed. ? ?I explained I am completing New OB Intake today. We discussed her EDD of 12/12/21 that is based on LMP of 03/07/21. Pt is G4/P2. I reviewed her allergies, medications, Medical/Surgical/OB history, and appropriate screenings. I informed her of [facility] services. Based on history, this is a/an  pregnancy uncomplicated .  ? ?Patient Active Problem List  ? Diagnosis Date Noted  ? GBS bacteriuria 05/05/2021  ? ? ?Concerns addressed today ? ?Delivery Plans:  ?Plans to deliver at [facility].  ? ?MyChart/Babyscripts ?MyChart access verified. I explained pt will have some visits in office and some virtually. Babyscripts instructions given and order placed. Patient verifies receipt of registration text/e-mail. Account successfully created and app downloaded. ? ?Blood Pressure Cuff  ?Patient is self-pay; explained patient will be given BP cuff at first prenatal appt. Explained after first prenatal appt pt will check weekly and document in 30. ? ?Weight scale: Patient does / does not  have weight scale. Weight scale ordered for patient to pick up from [employer].  ? ?Anatomy US ?Explained first scheduled [place] will be around 19 weeks. Anatomy US scheduled for 07/18/21 at 0145. Pt notified to arrive at [place]. ?Scheduled AFP lab only appointment if CenteringPregnancy pt for same day as anatomy US.  ? ?Labs ?Discussed Johnsie Cancel genetic screening  with patient. Would like both Panorama and Horizon drawn at new OB visit.Also if interested in genetic testing, tell patient she will need AFP 15-21 weeks to complete genetic testing .Routine prenatal labs needed. ? ?Covid Vaccine ?Patient has covid vaccine.  ? ?Is patient a CenteringPregnancy candidate? Not a candidate  ? "Centering Patient" indicated on sticky note ?  ?Is patient a Mom+Baby Combined Care candidate? Not a candidate   Scheduled with Mom+Baby provider  ?  ?Is patient interested in [place]? No  "Interested in [employer] - Schedule next visit with CNM" on sticky note ? ?Informed patient of [facility] website  and placed link in her AVS.  ? ?Social Determinants of Health ?Food Insecurity: Patient denies food insecurity. ?WIC Referral: Patient is interested in referral to [facility].  ?Transportation: Patient denies transportation needs. ?Childcare: Discussed no children allowed at ultrasound appointments. Offered childcare services; patient declines childcare services at this time. ? ?Send link to Pregnancy Navigators ? ? ?Placed OB Box on problem list and updated ? ?First visit review ?I reviewed new OB appt with pt. I explained she will have a pelvic exam, ob bloodwork with genetic screening, and PAP smear. Explained pt will be seen by Dr.Beard at first visit; encounter routed to appropriate provider. Explained that patient will be seen by pregnancy navigator following visit with provider. [facility] information placed in AVS.  ? ?Bethanne Ginger, CMA ?05/22/2021  2:08 PM  ?

## 2021-05-22 NOTE — Patient Instructions (Signed)
AREA PEDIATRIC/FAMILY PRACTICE PHYSICIANS ? ?[place] ([address]) ?[facility] ?Chambliss, MD; Eniola, MD; Hale, MD; Hensel, MD; McDiarmid, MD; McIntyer, MD; Lester Crickenberger, MD; Walden, MD ?[address] [place], [place] [address] ?[phone] ?Mon-Fri 8:30-12:30, 1:30-5:00 ?Providers come to see babies at [facility] ?Accepting Medicaid ?[facility] ?Limited providers who accept newborns: Koirala, MD; Morrow, MD; Wolters, MD ?[address], [place], [place] [address] ?[phone] ?Mon-Fri 8:00-5:30 ?Babies seen by providers at [facility] ?Does NOT accept Medicaid ?Please call early in hospitalization for appointment (limited availability)  ?[facility] ?Mulberry, MD ?[address] [place], [place] [address] ?[phone] ?Mon, Tue, Thur, Fri 8:30-5:00, Wed 10:00-7:00 (closed 1-2pm) ?Babies seen by [facility] providers ?Accepting Medicaid ?[facility] ?Rubin, MD ?[address], [place], [place] [address] ?[phone] ?Mon-Fri 8:30-5:00, Sat 8:30-12:00 ?Provider comes to see babies at [facility] ?Accepting Medicaid ?Must have been referred from current patients or contacted office prior to delivery ?[facility] ([facility]) ?Brown, MD; Chandler, MD; Ettefagh, MD; Grant, MD; Lester, MD; McCormick, MD; McQueen, MD; Prose, MD; Simha, MD; Stanley, MD; Stryffeler, NP; Tebben, NP ?[address], [place], [place] [address] ?[phone] ?Mon, Tue, Thur, Fri 8:30-5:30, Wed 9:30-5:30, Sat 8:30-12:30 ?Babies seen by [facility] providers ?Accepting Medicaid ?Only accepting infants of first-time parents or siblings of current patients ?Hospital discharge coordinator will make follow-up appointment ?Jack Amos ?[address], [place], [place]  [address] ?[phone]   Fax - [phone] ?[facility] ?[address], [place], [place]  [address] ?Phone - [phone]   Fax - [phone] ?Shilpa Gosrani ?[address], [place], [place]  [address] ?[phone] ? ?[place] ([address]) ?[facility] ?Bates, MD; Brassfield, MD; Cooper, Cox, MD; MD; Davis, MD; Dovico, MD; Ettefaugh, MD; Little, MD; Lowe, MD; Keiffer, MD; Melvin, MD; Sumner, MD; Williams, MD ?[address], [place], [place] [address] ?[phone] ?Mon-Fri 8:30-5:00 (extended evenings Mon-Thur as needed), Sat-Sun 10:00-1:00 ?Providers come to see babies at [facility] ?Accepting Medicaid for families of first-time babies and families with all children in the household age 3 and under. Must register with office prior to making appointment (M-F only). ?[facility] ?Henson, NP; Knapp, MD; Lalonde, MD; Tysinger, PA ?[address] [place], [place] [address] ?[phone] ?Mon-Fri 8:00-5:00 ?Babies seen by providers at [facility] ?Does NOT accept Medicaid/Commercial Insurance Only ?[facility] ([facility])  ?Artis, MD; Barnes, MD; Bratton, MD; Coccaro, MD; Lockett Gardner, MD; Kramer, MD; Marshall, MD; Netherton, MD; Poleto, MD; Skinner, MD ?[address] [place], [place] [address] ?[phone] ?Mon-Fri 8:30-5:30, Sat (Oct.-Mar.) 9:00-1:00 ?Babies seen by providers at [facility] ?Accepting Medicaid ? ?[place] ([address]) ?[facility] ?Reid, MD; Warner, MD ?[address], [place], [place] [address] ?[phone] ?Mon-Fri 8:30-5:00, Sat 8:30-12:00 ?Providers come to see babies at [facility] ?Does NOT accept Medicaid ?[facility] ?Becker, PA; Hagler, MD; Scifres, PA; Sun, MD; Swayne, MD ?[address], [place], [place] [address] ?[phone] ?Mon-Fri 8:00-5:00 ?Babies seen by providers at [facility] ?Does NOT accept Medicaid ?Only accepting babies of parents who  are patients ?Please call early in hospitalization for appointment (limited availability) ?[facility] ?Clark, MD; Frye, MD; Kelleher, MD; Mack, NP; Miller, MD; O'Keller, MD; Patterson, NP; Pudlo, MD; Puzio, MD; Thomas, MD; Tucker, MD; Twiselton, MD ?[address], [place], [place] [address] ?[phone] ?Mon-Fri 8:00-5:00, Sat 9:00-12:00 ?Providers come to see babies at [facility] ?Does NOT accept Medicaid ? ?[place] ([address]) ?[facility] ?Limited providers accepting new patients: Brake, NP; Wharton, PA ?[address], [place], [place] [address] ?[phone] ?Mon-Fri 8:00-5:00 ?Babies seen by providers at [facility] ?Does NOT accept Medicaid ?Only accepting babies of parents who are patients ?Please call early in hospitalization for appointment (limited availability) ?[facility] ?Gay, MD; Quinlan, MD ?[address] [place], [place] [address] ?[phone] (press [phone] to schedule appointment) ?Mon-Fri 8:00-5:00 ?Providers come to see babies at [facility] ?Does NOT accept Medicaid ?[facility] ?Mazer, MD ?[address] [place], [place] [address] ?[phone] ?Mon-Fri 8:30-5:00 (lunch 12:30-1:00), extended hours by appointment only Wed 5:00-6:30 ?Babies seen by [facility] providers ?Accepting Medicaid ?[facility] ?Banks, MD; Jordan, MD; Koberlein, MD ?[address], [place], [place] [address] ?[phone] ?Mon-Fri 8:00-5:00 ?Babies seen by [facility] providers ?Does NOT accept Medicaid ?[facility] ?Parker, MD; Hunter, MD; Wallace, DO ?[address] [place], [place] [address] ?[phone] ?Mon-Fri 8:00-5:00 ?Babies seen by [facility] providers ?Does NOT accept Medicaid ?[facility] ?Brandon, PA; Brecken, PA; Christy, NP; Dees, MD; DeClaire, MD; DeWeese, MD; Hansen, NP; Mills, NP; Parrish, NP; Smoot, NP; Summer, MD; Vapne,  MD ?[address] [place], [place] [address] ?[phone] ?Mon-Fri 8:30-5:00, Sat 10:00-1:00 ?Providers come to see babies at [facility] ?Does NOT accept Medicaid ?Free prenatal information session Tuesdays at 4:45pm ?[facility] ?Bouska, MD; Gordon, PA; Jeffery, PA; Weber, PA ?[address] [place] [address] ?[phone] ?Mon-Fri 7:30-5:30 ?Babies seen by [facility] providers ?[facility] ?[address], [place], [place]  [address] ?[phone]   Fax - [phone] ? ?[place] ([address]) ?[facility] ?Reese, MD ?[address] [place], [place] [address] ?[phone] ?Mon-Thur 8:00-6:00 ?Providers come to see babies at [facility] ?Accepting Medicaid ?[facility] ?Anderson, NP; Badger, MD; Beal, PA; Spencer, PA ?[address] [place], [place] [address] ?[phone] ?Mon-Thur 7:30-7:30, Fri 7:30-4:30 ?Babies seen by [facility] providers ?Accepting Medicaid ?[facility] ?Agbuya, MD; Klett, NP; Romgoolam, MD ?[address], [place], [place] [address] ?[phone] ?Mon-Fri 8:30-5:00, Sat 8:30-12:00 ?Providers come to see babies at [facility] ?Accepting Medicaid ?Must have ?Meet & Greet? appointment at office prior to delivery ?[facility] ([facility]) ?McCord, MD; Wallace, MD; Wood, MD ?[address], [place], [place] [address] ?[phone] ?Mon-Wed 8:00-6:00, Thur-Fri 8:00-5:00, Sat 9:00-12:00 ?Providers come to see babies at [facility] ?Does NOT accept Medicaid ?Only accepting siblings of current patients ?[facility]  ?[address], [place], [place]  [address] ?[phone]   Fax - [phone] ?[facility] ?[address], [place],   [address] ?[phone]   Fax -  [phone] ? ?[place] ([address]) ? [facility] ?Cirigliano, DO; Matthews, DO ?[address] ,  [address] ?[phone] ?Mon-Fri 7:00-5:00 ?Babies seen by [facility]

## 2021-05-23 LAB — CBC/D/PLT+RPR+RH+ABO+RUBIGG...
Antibody Screen: NEGATIVE
Basophils Absolute: 0 10*3/uL (ref 0.0–0.2)
Basos: 0 %
EOS (ABSOLUTE): 0.1 10*3/uL (ref 0.0–0.4)
Eos: 1 %
HCV Ab: NONREACTIVE
HIV Screen 4th Generation wRfx: NONREACTIVE
Hematocrit: 37 % (ref 34.0–46.6)
Hemoglobin: 13 g/dL (ref 11.1–15.9)
Hepatitis B Surface Ag: NEGATIVE
Immature Grans (Abs): 0 10*3/uL (ref 0.0–0.1)
Immature Granulocytes: 0 %
Lymphocytes Absolute: 2.3 10*3/uL (ref 0.7–3.1)
Lymphs: 25 %
MCH: 31.3 pg (ref 26.6–33.0)
MCHC: 35.1 g/dL (ref 31.5–35.7)
MCV: 89 fL (ref 79–97)
Monocytes Absolute: 0.5 10*3/uL (ref 0.1–0.9)
Monocytes: 5 %
Neutrophils Absolute: 6.3 10*3/uL (ref 1.4–7.0)
Neutrophils: 69 %
Platelets: 306 10*3/uL (ref 150–450)
RBC: 4.15 x10E6/uL (ref 3.77–5.28)
RDW: 12 % (ref 11.7–15.4)
RPR Ser Ql: NONREACTIVE
Rh Factor: POSITIVE
Rubella Antibodies, IGG: 2.5 index (ref >0.99)
WBC: 9.3 10*3/uL (ref 3.4–10.8)

## 2021-05-23 LAB — GC/CHLAMYDIA PROBE AMP (~~LOC~~) NOT AT ARMC
Chlamydia: NEGATIVE
Comment: NEGATIVE
Comment: NORMAL
Neisseria Gonorrhea: NEGATIVE

## 2021-05-23 LAB — HEMOGLOBIN A1C
Est. average glucose Bld gHb Est-mCnc: 105 mg/dL
Hgb A1c MFr Bld: 5.3 % (ref 4.8–5.6)

## 2021-05-23 LAB — HCV INTERPRETATION

## 2021-05-23 NOTE — Progress Notes (Signed)
Patient was evaluated by nursing staff. Agree with assessment and plan.  °

## 2021-05-25 LAB — URINE CULTURE, OB REFLEX: Organism ID, Bacteria: NO GROWTH

## 2021-05-25 LAB — CULTURE, OB URINE

## 2021-05-30 ENCOUNTER — Institutional Professional Consult (permissible substitution): Payer: Self-pay

## 2021-06-03 ENCOUNTER — Other Ambulatory Visit (HOSPITAL_COMMUNITY)
Admission: RE | Admit: 2021-06-03 | Discharge: 2021-06-03 | Disposition: A | Discharge status: Home / Self Care | Payer: Self-pay | Source: Ambulatory Visit | Attending: Family Medicine | Admitting: Family Medicine

## 2021-06-03 ENCOUNTER — Ambulatory Visit (INDEPENDENT_AMBULATORY_CARE_PROVIDER_SITE_OTHER): Payer: Self-pay | Admitting: Family Medicine

## 2021-06-03 ENCOUNTER — Encounter: Payer: Self-pay | Admitting: Family Medicine

## 2021-06-03 VITALS — BP 98/66 | HR 85 | Wt 91.2 lb

## 2021-06-03 DIAGNOSIS — Z3480 Encounter for supervision of other normal pregnancy, unspecified trimester: Secondary | ICD-10-CM | POA: Insufficient documentation

## 2021-06-03 DIAGNOSIS — Z3A12 12 weeks gestation of pregnancy: Secondary | ICD-10-CM

## 2021-06-03 DIAGNOSIS — Z124 Encounter for screening for malignant neoplasm of cervix: Secondary | ICD-10-CM

## 2021-06-03 DIAGNOSIS — Z789 Other specified health status: Secondary | ICD-10-CM

## 2021-06-03 DIAGNOSIS — Z8759 Personal history of other complications of pregnancy, childbirth and the puerperium: Secondary | ICD-10-CM

## 2021-06-03 DIAGNOSIS — R8271 Bacteriuria: Secondary | ICD-10-CM

## 2021-06-03 MED ORDER — ASPIRIN EC 81 MG PO TBEC: 81.0000 mg | DELAYED_RELEASE_TABLET | Freq: Every day | ORAL | 2 refills | Status: DC

## 2021-06-03 NOTE — Progress Notes (Signed)
? ?History:  ? Terri Kane is a 31 y.o. [id number] at [redacted]w[redacted]d by LMP being seen today for her first obstetrical visit.  Her obstetrical history is significant for  low blood pressure during both of her previous pregnancies . She also reports a history of preeclampsia at the end of her last pregnancy in [place]. She believes she needed IV magnesium. However states she was not induced at this diagnosis and delivered at 40 weeks. Says her blood pressure is always low and doesn't remember it getting high.   ? ? Patient does intend to breast feed. Pregnancy history fully reviewed. ? ?Patient reports  intermittent aching headache that starts at the back of her neck and wraps around to both temples like a band across her forehead . Tylenol helps some but comes back. NV is under control with medication.  ? ?Using Adopt-a-mom program.  ?  ? ?  ?HISTORY: ?OB History  ?Gravida Para Term Preterm AB Living  ?4 2 2  0 1 2  ?SAB IAB Ectopic Multiple Live Births  ?1 0 0 0 2  ?  ?# Outcome Date GA Lbr Len/2nd Weight Sex Delivery Anes PTL Lv  ?4 Current           ?3 SAB           ?2 Term      Vag-Spont   LIV  ?1 Term      Vag-Spont   LIV  ?  ?Last pap smear was done awhile ago. Due for one today.  ? ?Past Medical History:  ?Diagnosis Date  ? Hypotension   ? ?Past Surgical History:  ?Procedure Laterality Date  ? NO PAST SURGERIES    ? ?No family history on file. ?Social History  ? ?Tobacco Use  ? Smoking status: Former  ?  Types: Cigarettes  ? Smokeless tobacco: Never  ?Vaping Use  ? Vaping Use: Never used  ?Substance Use Topics  ? Alcohol use: Not Currently  ? Drug use: Never  ? ?No Known Allergies ?Current Outpatient Medications on File Prior to Visit  ?Medication Sig Dispense Refill  ? famotidine (PEPCID) 20 MG tablet Take 1 tablet (20 mg total) by mouth 2 (two) times daily. 60 tablet 3  ? Prenatal Vit-Fe Fumarate-FA (MULTIVITAMIN-PRENATAL) 27-0.8 MG TABS tablet Take 1 tablet by mouth daily at 12 noon.    ? promethazine  (PHENERGAN) 25 MG tablet Take 1 tablet (25 mg total) by mouth every 6 (six) hours as needed for nausea or vomiting. 30 tablet 1  ? ?No current facility-administered medications on file prior to visit.  ? ? ?Review of Systems ?Pertinent items noted in HPI and remainder of comprehensive ROS otherwise negative. ?Physical Exam:  ? ?Vitals:  ? 06/03/21 1336 06/03/21 1356  ?BP: (!) 81/71 98/66  ?Pulse: 83 85  ?Weight: 91 lb 3.2 oz (41.4 kg)   ? ?Fetal Heart Rate (bpm): 152 ?Constitutional: Well-developed, well-nourished pregnant female in no acute distress.  ?HEENT: PERRL ?Skin: normal color and turgor, no rash ?Cardiovascular: normal rate  ?Respiratory: normal effort ?GI: Abd soft, non-tender ?MS: Extremities nontender, no edema. Several trapezius trigger points bilaterally that re-create her HA that she has been having whenever pressed.  ?Neurologic: Alert and oriented x 4.  ?GU: no CVA tenderness ?Pelvic: NEFG, physiologic discharge, cervix clean. Pap/swabs collected. Cervix friable with scant bleeding after swabs.  ? ?Assessment:  ?  ?Pregnancy: 09-26-1982 ?Patient Active Problem List  ? Diagnosis Date Noted  ? Supervision of other normal  pregnancy, antepartum 05/22/2021  ? GBS bacteriuria 05/05/2021  ? ?  ?Plan:  ?  ?1. Supervision of other normal pregnancy, antepartum ?Doing well. Reviewed normal initial labs with patient.  ? ?2. [redacted] weeks gestation of pregnancy ? ?3. History of pre-eclampsia ?Patient clearly reports that she was told she had preeclampsia in her last pregnancy, but story is atypical without endorsement of elevated blood pressure and delivery at 40 weeks two weeks after she reports she was diagnosed. Regardless, will err on side of caution and start ASA.  ?- aspirin EC 81 MG tablet; Take 1 tablet (81 mg total) by mouth daily. Swallow whole.  Dispense: 90 tablet; Refill: 2 ? ?4. GBS bacteriuria ?Need abx in labor.  ? ?5. Screening for cervical cancer ?Normal pelvic. Friable cervix, provided reassurance  and expectation of scant vaginal bleeding after this.  ?- Cytology - PAP( [place]) ? ?6. Language barrier ?In person spanish interpreter used.   ? ? ?Continue prenatal vitamins. ?Problem list reviewed and updated. ?Genetic Screening discussed, ordered ?Ultrasound discussed; fetal anatomic survey: ordered.  ?Anticipatory guidance about prenatal visits given including labs, ultrasounds, and testing. ?Discussed usage of Babyscripts and virtual visits as additional source of managing and completing prenatal visits in midst of coronavirus and pandemic.   ?Encouraged to complete MyChart Registration for her ability to review results, send requests, and have questions addressed.  ?The nature of [facility] with multiple MDs and Advanced Practice Providers was explained to patient; also emphasized that residents, students are part of our team. ?Routine obstetric precautions reviewed. Encouraged to seek out care at office or emergency room [facility] preferred) for urgent and/or emergent concerns. ? ?Return in about 4 weeks (around 07/01/2021) for LROB.  ?  ? ?Leticia Penna, DO ?Ob Fellow  ? ?06/03/2021  4:57 PM ? ? ?  ? ? ? ?

## 2021-06-03 NOTE — Progress Notes (Signed)
Patient reports daily headaches that tend to linger along with black speckles in vision. She also reports pain in "spine and collar" ?

## 2021-06-05 LAB — CYTOLOGY - PAP
Adequacy: ABSENT
Chlamydia: NEGATIVE
Comment: NEGATIVE
Comment: NORMAL
Diagnosis: NEGATIVE
Neisseria Gonorrhea: NEGATIVE

## 2021-06-09 ENCOUNTER — Institutional Professional Consult (permissible substitution): Payer: Self-pay

## 2021-06-10 ENCOUNTER — Telehealth: Payer: Self-pay

## 2021-06-10 DIAGNOSIS — N898 Other specified noninflammatory disorders of vagina: Secondary | ICD-10-CM

## 2021-06-10 MED ORDER — TERCONAZOLE 0.4 % VA CREA: 1.0000 | TOPICAL_CREAM | Freq: Every day | VAGINAL | 0 refills | Status: DC

## 2021-06-10 NOTE — Telephone Encounter (Addendum)
-----   Message from Allayne Stack, DO sent at 06/05/2021  6:15 PM EDT ----- ?Please let patient know her pap smear is NORMAL. Next will be in 3 years. Additionally, she has some yeast noted on the pap smear, if she has been having any itching or irritation then I can send in some vaginal cream?  ? ?Thank you!  ?Dr Annia Friendly  ? ? ?Called pt with interpreter Raquel; pt reports new itching and burning with urination beginning 3 days ago. Urine culture negative from 05/22/21. Recommended patient try Terazol or Monistat to see if this relieves symptoms. Recommended nurse visit for UA and possible repeat urine culture. Pt agreeable to appt for nurse visit. Will be in office tomorrow AM for behavioral health appt.  ?

## 2021-06-11 ENCOUNTER — Ambulatory Visit: Payer: Self-pay

## 2021-06-11 ENCOUNTER — Encounter (HOSPITAL_COMMUNITY): Payer: Self-pay | Admitting: Obstetrics & Gynecology

## 2021-06-11 ENCOUNTER — Inpatient Hospital Stay (HOSPITAL_COMMUNITY)
Admission: AD | Admit: 2021-06-11 | Discharge: 2021-06-11 | Disposition: A | Discharge status: Home / Self Care | Payer: Self-pay | Attending: Obstetrics & Gynecology | Admitting: Obstetrics & Gynecology

## 2021-06-11 DIAGNOSIS — R519 Headache, unspecified: Secondary | ICD-10-CM | POA: Insufficient documentation

## 2021-06-11 DIAGNOSIS — R109 Unspecified abdominal pain: Secondary | ICD-10-CM | POA: Insufficient documentation

## 2021-06-11 DIAGNOSIS — O99611 Diseases of the digestive system complicating pregnancy, first trimester: Secondary | ICD-10-CM | POA: Insufficient documentation

## 2021-06-11 DIAGNOSIS — Z3A13 13 weeks gestation of pregnancy: Secondary | ICD-10-CM | POA: Insufficient documentation

## 2021-06-11 DIAGNOSIS — Z3480 Encounter for supervision of other normal pregnancy, unspecified trimester: Secondary | ICD-10-CM

## 2021-06-11 DIAGNOSIS — O26891 Other specified pregnancy related conditions, first trimester: Secondary | ICD-10-CM | POA: Insufficient documentation

## 2021-06-11 DIAGNOSIS — K219 Gastro-esophageal reflux disease without esophagitis: Secondary | ICD-10-CM | POA: Insufficient documentation

## 2021-06-11 DIAGNOSIS — O210 Mild hyperemesis gravidarum: Secondary | ICD-10-CM | POA: Insufficient documentation

## 2021-06-11 DIAGNOSIS — O219 Vomiting of pregnancy, unspecified: Secondary | ICD-10-CM

## 2021-06-11 HISTORY — DX: Urinary tract infection, site not specified: N39.0

## 2021-06-11 HISTORY — DX: Unspecified ovarian cyst, unspecified side: N83.209

## 2021-06-11 HISTORY — DX: Headache, unspecified: R51.9

## 2021-06-11 HISTORY — DX: Depression, unspecified: F32.A

## 2021-06-11 LAB — URINALYSIS, ROUTINE W REFLEX MICROSCOPIC
Bilirubin Urine: NEGATIVE
Glucose, UA: NEGATIVE mg/dL
Hgb urine dipstick: NEGATIVE
Ketones, ur: NEGATIVE mg/dL
Nitrite: NEGATIVE
Protein, ur: NEGATIVE mg/dL
Specific Gravity, Urine: 1.02 (ref 1.005–1.030)
pH: 7 (ref 5.0–8.0)

## 2021-06-11 LAB — COMPREHENSIVE METABOLIC PANEL
ALT: 21 U/L (ref 0–44)
AST: 15 U/L (ref 15–41)
Albumin: 3.3 g/dL — ABNORMAL LOW (ref 3.5–5.0)
Alkaline Phosphatase: 52 U/L (ref 38–126)
Anion gap: 8 (ref 5–15)
BUN: 8 mg/dL (ref 6–20)
CO2: 24 mmol/L (ref 22–32)
Calcium: 8.8 mg/dL — ABNORMAL LOW (ref 8.9–10.3)
Chloride: 105 mmol/L (ref 98–111)
Creatinine, Ser: 0.55 mg/dL (ref 0.44–1.00)
GFR, Estimated: >60 mL/min (ref >60)
Glucose, Bld: 89 mg/dL (ref 70–99)
Potassium: 3.5 mmol/L (ref 3.5–5.1)
Sodium: 137 mmol/L (ref 135–145)
Total Bilirubin: 0.4 mg/dL (ref 0.3–1.2)
Total Protein: 6.1 g/dL — ABNORMAL LOW (ref 6.5–8.1)

## 2021-06-11 LAB — CBC WITH DIFFERENTIAL/PLATELET
Abs Immature Granulocytes: 0.04 10*3/uL (ref 0.00–0.07)
Basophils Absolute: 0 10*3/uL (ref 0.0–0.1)
Basophils Relative: 0 %
Eosinophils Absolute: 0.1 10*3/uL (ref 0.0–0.5)
Eosinophils Relative: 1 %
HCT: 34.4 % — ABNORMAL LOW (ref 36.0–46.0)
Hemoglobin: 12 g/dL (ref 12.0–15.0)
Immature Granulocytes: 0 %
Lymphocytes Relative: 22 %
Lymphs Abs: 2 10*3/uL (ref 0.7–4.0)
MCH: 31.1 pg (ref 26.0–34.0)
MCHC: 34.9 g/dL (ref 30.0–36.0)
MCV: 89.1 fL (ref 80.0–100.0)
Monocytes Absolute: 0.5 10*3/uL (ref 0.1–1.0)
Monocytes Relative: 5 %
Neutro Abs: 6.5 10*3/uL (ref 1.7–7.7)
Neutrophils Relative %: 72 %
Platelets: 251 10*3/uL (ref 150–400)
RBC: 3.86 MIL/uL — ABNORMAL LOW (ref 3.87–5.11)
RDW: 12.5 % (ref 11.5–15.5)
WBC: 9.1 10*3/uL (ref 4.0–10.5)
nRBC: 0 % (ref 0.0–0.2)

## 2021-06-11 LAB — OB RESULTS CONSOLE GBS: GBS: POSITIVE

## 2021-06-11 LAB — LIPASE, BLOOD: Lipase: 28 U/L (ref 11–51)

## 2021-06-11 MED ORDER — CYCLOBENZAPRINE HCL 10 MG PO TABS: 10.0000 mg | ORAL_TABLET | Freq: Two times a day (BID) | ORAL | 0 refills | Status: DC | PRN

## 2021-06-11 MED ORDER — LACTATED RINGERS IV BOLUS
1000.0000 mL | Freq: Once | INTRAVENOUS | Status: AC
  Administered 2021-06-11: 1000 mL via INTRAVENOUS

## 2021-06-11 MED ORDER — SODIUM CHLORIDE 0.9 % IV SOLN
25.0000 mg | Freq: Once | INTRAVENOUS | Status: AC
  Administered 2021-06-11: 25 mg via INTRAVENOUS
  Filled 2021-06-11: qty 1

## 2021-06-11 MED ORDER — ALUM & MAG HYDROXIDE-SIMETH 200-200-20 MG/5ML PO SUSP
30.0000 mL | Freq: Once | ORAL | Status: AC
  Administered 2021-06-11: 30 mL via ORAL
  Filled 2021-06-11: qty 30

## 2021-06-11 MED ORDER — ONDANSETRON 4 MG PO TBDP
4.0000 mg | ORAL_TABLET | Freq: Once | ORAL | Status: AC
  Administered 2021-06-11: 4 mg via ORAL
  Filled 2021-06-11: qty 1

## 2021-06-11 NOTE — MAU Provider Note (Addendum)
?History  ?  ? ?CSN: [id number] ? ?Arrival date and time: 06/11/21 [id number] ? ? None  ?  ? ?Chief Complaint  ?Patient presents with  ? Abdominal Pain  ? ?Terri Kane is a 31 year old [id number] female at 2468w5d who presents with chief complaint of abdominal pain and [facility] to elevated BP. She states her abdominal pain started last night with sudden onset. She states she was lying down when the pain started. The pain is located in her upper and middle abdomen and comes and goes, occurring about 4-5 times per day. She also reports vomiting, which started with the abdominal pain and has vomited 4 times today. She states that she hasn't taken her Phenergan or Pepcid today, but those medications normally help her nausea and vomiting. Vomit is clear. She states she also has a 10/10 headache that has been occurring every day in her pregnancy. She has a history of mild headaches that resolved with Tylenol, but Tylenol has not helped her headaches since she became pregnant. She endorses vaginal discharge that's white and odorless with associated urinary urgency, frequency, and dysuria. These symptoms began Saturday. She was prescribed Terazole yesterday but hasn't picked it up from the pharmacy yet.  ? ?Abdominal Pain ?This is a recurrent problem. The current episode started yesterday. The onset quality is sudden. The problem occurs 2 to 4 times per day. The problem has been unchanged. The pain is located in the epigastric region and periumbilical region. The pain is at a severity of 10/10. The pain is severe. The quality of the pain is sharp. The abdominal pain does not radiate. Associated symptoms include anorexia, constipation, dysuria, frequency, headaches, nausea and vomiting. Pertinent negatives include no diarrhea, fever, hematochezia or hematuria. The pain is relieved by Nothing. She has tried nothing for the symptoms.  ? ?OB History   ? ? Gravida  ?4  ? Para  ?2  ? Term  ?2  ? Preterm  ?    ? AB  ?1  ? Living  ?2  ?  ? ? SAB  ?1  ? IAB  ?   ? Ectopic  ?   ? Multiple  ?   ? Live Births  ?2  ?   ?  ?  ? ? ?Past Medical History:  ?Diagnosis Date  ? Depression   ? feeling better  ? Headache   ? Hypotension   ? Ovarian cyst   ? UTI (urinary tract infection)   ? ? ?Past Surgical History:  ?Procedure Laterality Date  ? NO PAST SURGERIES    ? ? ?Family History  ?Problem Relation Age of Onset  ? Healthy Mother   ? Healthy Father   ? ? ?Social History  ? ?Tobacco Use  ? Smoking status: Never  ? Smokeless tobacco: Never  ?Vaping Use  ? Vaping Use: Never used  ?Substance Use Topics  ? Alcohol use: Not Currently  ? Drug use: Never  ? ? ?Allergies: No Known Allergies ? ?No medications prior to admission.  ? ? ?Review of Systems  ?Constitutional:  Positive for appetite change and chills. Negative for fever.  ?Respiratory:  Negative for cough, chest tightness and shortness of breath.   ?Cardiovascular:  Negative for chest pain, palpitations and leg swelling.  ?Gastrointestinal:  Positive for abdominal pain, anorexia, constipation, nausea and vomiting. Negative for abdominal distention, diarrhea and hematochezia.  ?Genitourinary:  Positive for dysuria, frequency, urgency and vaginal discharge. Negative for  hematuria and vaginal bleeding.  ?Neurological:  Positive for headaches. Negative for syncope and light-headedness.  ?Physical Exam  ? ?Blood pressure (!) 89/61, pulse 75, temperature 98 ?F (36.7 ?C), temperature source Oral, resp. rate 16, last menstrual period 03/07/2021, SpO2 100 %. ? ?Physical Exam ?Constitutional:   ?   General: She is not in acute distress. ?   Appearance: Normal appearance. She is not ill-appearing.  ?HENT:  ?   Head: Normocephalic and atraumatic.  ?   Right Ear: External ear normal.  ?   Left Ear: External ear normal.  ?   Nose: Nose normal.  ?Eyes:  ?   General:     ?   Right eye: No discharge.     ?   Left eye: No discharge.  ?Cardiovascular:  ?   Rate and Rhythm: Normal rate and regular  rhythm.  ?   Pulses: Normal pulses.  ?   Heart sounds: Normal heart sounds. No murmur heard. ?  No friction rub. No gallop.  ?Pulmonary:  ?   Effort: Pulmonary effort is normal. No respiratory distress.  ?   Breath sounds: Normal breath sounds.  ?Abdominal:  ?   General: Bowel sounds are normal. There is no distension.  ?   Palpations: Abdomen is soft.  ?   Tenderness: There is abdominal tenderness in the epigastric area and suprapubic area. There is right CVA tenderness and left CVA tenderness. There is no rebound.  ?   Hernia: No hernia is present.  ?   Comments: Suprapubic and epigastric tenderness to palpation  ?Musculoskeletal:     ?   General: Normal range of motion.  ?   Cervical back: Normal range of motion.  ?Skin: ?   General: Skin is warm and dry.  ?Neurological:  ?   General: No focal deficit present.  ?   Mental Status: She is alert and oriented to person, place, and time. Mental status is at baseline.  ?Psychiatric:     ?   Mood and Affect: Mood normal.     ?   Behavior: Behavior normal.  ? ? ?MAU Course/MDM  ?Procedures ? ?MDM ?--Report received from Marylynn Pearson, RN who evaluated patient in office ?--Normotensive throughout MAU encounter ?--Abdominal pain resolved with treatment for reflux ?--CNM at bedside at for report of pain at IV site. Patient decline PO medication, states the pain was specific to her taped site, not the IV itself, verbalizes desire to restart IV Phenergan.  ?--Headache improved with IV Phenergan ? ?Orders Placed This Encounter  ?Procedures  ? Culture, OB Urine  ? Urinalysis, Routine w reflex microscopic Urine, Clean Catch  ? CBC with Differential/Platelet  ? Lipase, blood  ? Comprehensive metabolic panel  ? Measure blood pressure  ? Insert peripheral IV  ? Discharge patient  ? ?Patient Vitals for the past 24 hrs: ? BP Temp Temp src Pulse Resp SpO2  ?06/11/21 1324 (!) 89/61 -- -- 75 -- --  ?06/11/21 1015 100/64 -- -- 71 -- --  ?06/11/21 1000 97/62 -- -- 74 -- --  ?06/11/21  0945 95/64 -- -- 77 -- --  ?06/11/21 0930 106/66 -- -- 75 -- --  ?06/11/21 0924 102/68 -- -- 79 -- --  ?06/11/21 0845 -- -- -- -- -- 100 %  ?06/11/21 0844 110/64 98 ?F (36.7 ?C) Oral 73 16 --  ? ?Results for orders placed or performed during the hospital encounter of 06/11/21 (from the past 24 hour(s))  ?Urinalysis, Routine w  reflex microscopic Urine, Clean Catch     Status: Abnormal  ? Collection Time: 06/11/21  8:58 AM  ?Result Value Ref Range  ? Color, Urine YELLOW YELLOW  ? APPearance CLOUDY (A) CLEAR  ? Specific Gravity, Urine 1.020 1.005 - 1.030  ? pH 7.0 5.0 - 8.0  ? Glucose, UA NEGATIVE NEGATIVE mg/dL  ? Hgb urine dipstick NEGATIVE NEGATIVE  ? Bilirubin Urine NEGATIVE NEGATIVE  ? Ketones, ur NEGATIVE NEGATIVE mg/dL  ? Protein, ur NEGATIVE NEGATIVE mg/dL  ? Nitrite NEGATIVE NEGATIVE  ? Leukocytes,Ua LARGE (A) NEGATIVE  ? RBC / HPF 0-5 0 - 5 RBC/hpf  ? WBC, UA 11-20 0 - 5 WBC/hpf  ? Bacteria, UA RARE (A) NONE SEEN  ? Squamous Epithelial / LPF 11-20 0 - 5  ? Mucus PRESENT   ? Amorphous Crystal PRESENT   ?CBC with Differential/Platelet     Status: Abnormal  ? Collection Time: 06/11/21  9:37 AM  ?Result Value Ref Range  ? WBC 9.1 4.0 - 10.5 K/uL  ? RBC 3.86 (L) 3.87 - 5.11 MIL/uL  ? Hemoglobin 12.0 12.0 - 15.0 g/dL  ? HCT 34.4 (L) 36.0 - 46.0 %  ? MCV 89.1 80.0 - 100.0 fL  ? MCH 31.1 26.0 - 34.0 pg  ? MCHC 34.9 30.0 - 36.0 g/dL  ? RDW 12.5 11.5 - 15.5 %  ? Platelets 251 150 - 400 K/uL  ? nRBC 0.0 0.0 - 0.2 %  ? Neutrophils Relative % 72 %  ? Neutro Abs 6.5 1.7 - 7.7 K/uL  ? Lymphocytes Relative 22 %  ? Lymphs Abs 2.0 0.7 - 4.0 K/uL  ? Monocytes Relative 5 %  ? Monocytes Absolute 0.5 0.1 - 1.0 K/uL  ? Eosinophils Relative 1 %  ? Eosinophils Absolute 0.1 0.0 - 0.5 K/uL  ? Basophils Relative 0 %  ? Basophils Absolute 0.0 0.0 - 0.1 K/uL  ? Immature Granulocytes 0 %  ? Abs Immature Granulocytes 0.04 0.00 - 0.07 K/uL  ?Lipase, blood     Status: None  ? Collection Time: 06/11/21  9:37 AM  ?Result Value Ref Range  ?  Lipase 28 11 - 51 U/L  ?Comprehensive metabolic panel     Status: Abnormal  ? Collection Time: 06/11/21  9:37 AM  ?Result Value Ref Range  ? Sodium 137 135 - 145 mmol/L  ? Potassium 3.5 3.5 - 5.1 mmol/L  ? C

## 2021-06-11 NOTE — MAU Note (Signed)
Terri Kane is a 31 y.o. at [redacted]w[redacted]d here in MAU reporting: pain in upper abd, sudden onset - not related to any event. Comes and goes, when comes, it starts at the top and goes to lower abd.  Has been vomiting, did not take med this morning "too early".  Denies diarrhea or constipation. No vag bleeding, +d/c w/itching and burning.  Sent from clinic ? ?Onset of complaint: yesterday ?Pain score: 10 ?Vitals:  ? 06/11/21 0844 06/11/21 0845  ?BP: 110/64   ?Pulse: 73   ?Resp: 16   ?Temp: 98 ?F (36.7 ?C)   ?SpO2:  100%  ?   ?FHT:154 ?Lab orders placed from triage:  urine ?

## 2021-06-12 LAB — CULTURE, OB URINE: Culture: 5000 — AB

## 2021-06-17 ENCOUNTER — Telehealth: Payer: Self-pay | Admitting: Student

## 2021-06-17 NOTE — Telephone Encounter (Signed)
Called patient to discuss centering; patient would like to try centering group! Will move appt to May 4 at 9 am.  ? ?Terri Kane  ?

## 2021-06-20 ENCOUNTER — Telehealth: Payer: Self-pay

## 2021-06-20 NOTE — Telephone Encounter (Signed)
I called patient with the help of [place] interpreter. Patient did not answer, voicemail left stating patient results were normal. Instructed patient to call the office back if she had any questions.  ? ?Paulina Fusi, RN ?06/20/21 ?

## 2021-07-03 ENCOUNTER — Ambulatory Visit (INDEPENDENT_AMBULATORY_CARE_PROVIDER_SITE_OTHER): Payer: Self-pay | Admitting: Student

## 2021-07-03 ENCOUNTER — Encounter: Payer: Self-pay | Admitting: Student

## 2021-07-03 VITALS — BP 107/71 | HR 98 | Wt 97.0 lb

## 2021-07-03 DIAGNOSIS — Z3480 Encounter for supervision of other normal pregnancy, unspecified trimester: Secondary | ICD-10-CM

## 2021-07-03 DIAGNOSIS — Z3A16 16 weeks gestation of pregnancy: Secondary | ICD-10-CM

## 2021-07-03 NOTE — Progress Notes (Addendum)
? ?  ? ? ? ? ? ?  PRENATAL VISIT NOTE- Centering Pregnancy Cycle 3, Session # 3 ? ?Subjective:  ?Terri Kane is a 31 y.o. [id number] at [redacted]w[redacted]d being seen today for ongoing prenatal care through Centering Pregnancy.  She is currently monitored for the following issues for this low-risk pregnancy and has GBS bacteriuria and Supervision of other normal pregnancy, antepartum on their problem list. ? ?Patient reports no complaints.   .  .   . Denies leaking of fluid/ROM.  ? ?The following portions of the patient's history were reviewed and updated as appropriate: allergies, current medications, past family history, past medical history, past social history, past surgical history and problem list. Problem list updated. ? ?Objective:  ? ?Vitals:  ? 07/03/21 0924  ?BP: 107/71  ?Pulse: 98  ?Weight: 97 lb (44 kg)  ? ? ?Fetal Status: Fetal Heart Rate (bpm): 154        ? ?General:  Alert, oriented and cooperative. Patient is in no acute distress.  ?Skin: Skin is warm and dry. No rash noted.   ?Cardiovascular: Normal heart rate noted  ?Respiratory: Normal respiratory effort, no problems with respiration noted  ?Abdomen: Soft, gravid, appropriate for gestational age.        ?Pelvic: Cervical exam deferred        ?Extremities: Normal range of motion.     ?Mental Status: Normal mood and affect. Normal behavior. Normal judgment and thought content.  ? ?Assessment and Plan:  ?Pregnancy: [id number] at [redacted]w[redacted]d ? ?1. Supervision of other normal pregnancy, antepartum ?-reviewed [place] appt ?-patient will do AFP at next visit ?-welcomed patient to group; patient very engaged!  ?-reminded her to take baby ASA ? ? ?Centering Pregnancy, Session#3: Reviewed resources in [employer].  ? ?Facilitated discussion today:  Preeclampsia s/s discussed, reviewed vital signs for pre-e, breastfeeding/infant nutrition ?Mindfulness activity completed ?Fundal height and FHR appropriate today unless noted otherwise in plan. Patient to continue group  care.  ? ? ? ? ? ?Preterm labor symptoms and general obstetric precautions including but not limited to vaginal bleeding, contractions, leaking of fluid and fetal movement were reviewed in detail with the patient. ?Please refer to After Visit Summary for other counseling recommendations.  ?No follow-ups on file. ? ?Future Appointments  ?Date Time Provider [place]  ?07/18/2021  1:30 PM [facility] NURSE [facility]  ?07/18/2021  1:45 PM [facility] US5 [facility]  ?07/31/2021  9:00 AM CENTERING PROVIDER [facility] [place]  ?07/31/2021 11:00 AM [facility]A LAB [facility]  ?08/14/2021  9:00 AM CENTERING PROVIDER [facility] [place]  ?08/28/2021  9:00 AM CENTERING PROVIDER [facility] [place]  ?09/11/2021  9:00 AM CENTERING PROVIDER [facility] [place]  ?09/25/2021  9:00 AM CENTERING PROVIDER [facility] [place]  ?10/09/2021  9:00 AM CENTERING PROVIDER [facility] [place]  ?10/23/2021  9:00 AM CENTERING PROVIDER [facility] [place]  ? ? ?Starr [place], CNM ? ?

## 2021-07-18 ENCOUNTER — Other Ambulatory Visit: Payer: Self-pay

## 2021-07-18 ENCOUNTER — Ambulatory Visit: Payer: Self-pay

## 2021-07-31 ENCOUNTER — Ambulatory Visit (INDEPENDENT_AMBULATORY_CARE_PROVIDER_SITE_OTHER): Payer: Self-pay | Admitting: Student

## 2021-07-31 ENCOUNTER — Other Ambulatory Visit: Payer: Self-pay

## 2021-07-31 VITALS — BP 106/65 | HR 93 | Wt 105.4 lb

## 2021-07-31 DIAGNOSIS — Z3480 Encounter for supervision of other normal pregnancy, unspecified trimester: Secondary | ICD-10-CM

## 2021-07-31 DIAGNOSIS — Z3A20 20 weeks gestation of pregnancy: Secondary | ICD-10-CM

## 2021-07-31 NOTE — Progress Notes (Signed)
       PRENATAL VISIT NOTE- Centering Pregnancy Cycle 3, Session # 4  Subjective:  Terri Kane is a 31 y.o. [id number] at [redacted]w[redacted]d being seen today for ongoing prenatal care through Centering Pregnancy.  She is currently monitored for the following issues for this low-risk pregnancy and has GBS bacteriuria and Supervision of other normal pregnancy, antepartum on their problem list.  Patient reports  feeling light headed and weak .  Contractions: Not present. Vag. Bleeding: None.  Movement: Present. Denies leaking of fluid/ROM.   The following portions of the patient's history were reviewed and updated as appropriate: allergies, current medications, past family history, past medical history, past social history, past surgical history and problem list. Problem list updated.  Objective:   Vitals:   07/31/21 0918  BP: 106/65  Pulse: 93  Weight: 105 lb 6.4 oz (47.8 kg)    Fetal Status: Fetal Heart Rate (bpm): 162 Fundal Height: 20 cm Movement: Present     General:  Alert, oriented and cooperative. Patient is in no acute distress.  Skin: Skin is warm and dry. No rash noted.   Cardiovascular: Normal heart rate noted  Respiratory: Normal respiratory effort, no problems with respiration noted  Abdomen: Soft, gravid, appropriate for gestational age.        Pelvic: Cervical exam deferred        Extremities: Normal range of motion.     Mental Status: Normal mood and affect. Normal behavior. Normal judgment and thought content.   Assessment and Plan:  Pregnancy: [id number] at [redacted]w[redacted]d  1. Supervision of other normal pregnancy, antepartum -patient went to Room #1 and rested; she reports that she felt better. She then came to Centering room and ate a snack and participated in the group.  -she was reminded about [place] appt tomorrow and that she also needs AFP tomorrow.    Centering Pregnancy, Session#4: Reviewed resources in [employer].   Facilitated discussion today:  Long  discussion about family planning, BTL, instructions for two hour test, IPV.   Fundal height and FHR appropriate today unless noted otherwise in plan. Patient to continue group care.     Preterm labor symptoms and general obstetric precautions including but not limited to vaginal bleeding, contractions, leaking of fluid and fetal movement were reviewed in detail with the patient. Please refer to After Visit Summary for other counseling recommendations.  No follow-ups on file.  Future Appointments  Date Time Provider [place]  08/01/2021  7:30 AM [facility] NURSE [facility]  08/01/2021  7:45 AM [facility] US6 [facility]  08/01/2021  8:50 AM [facility] LAB [facility]  08/14/2021  9:00 AM CENTERING PROVIDER [facility]  08/28/2021  9:00 AM CENTERING PROVIDER [facility]  09/11/2021  9:00 AM CENTERING PROVIDER [facility]  09/25/2021  9:00 AM CENTERING PROVIDER [facility]  10/09/2021  9:00 AM CENTERING PROVIDER [facility]  10/23/2021  9:00 AM CENTERING PROVIDER [facility] [place], CNM

## 2021-08-01 ENCOUNTER — Ambulatory Visit: Payer: Self-pay | Attending: Family Medicine | Admitting: *Deleted

## 2021-08-01 ENCOUNTER — Ambulatory Visit (HOSPITAL_BASED_OUTPATIENT_CLINIC_OR_DEPARTMENT_OTHER): Payer: Self-pay

## 2021-08-01 ENCOUNTER — Encounter: Payer: Self-pay | Admitting: *Deleted

## 2021-08-01 ENCOUNTER — Other Ambulatory Visit: Payer: Self-pay | Admitting: *Deleted

## 2021-08-01 ENCOUNTER — Other Ambulatory Visit: Payer: Self-pay

## 2021-08-01 VITALS — BP 99/65 | HR 75

## 2021-08-01 DIAGNOSIS — Z3689 Encounter for other specified antenatal screening: Secondary | ICD-10-CM

## 2021-08-01 DIAGNOSIS — O09292 Supervision of pregnancy with other poor reproductive or obstetric history, second trimester: Secondary | ICD-10-CM | POA: Insufficient documentation

## 2021-08-01 DIAGNOSIS — Z3480 Encounter for supervision of other normal pregnancy, unspecified trimester: Secondary | ICD-10-CM

## 2021-08-01 DIAGNOSIS — O09299 Supervision of pregnancy with other poor reproductive or obstetric history, unspecified trimester: Secondary | ICD-10-CM

## 2021-08-01 DIAGNOSIS — Z363 Encounter for antenatal screening for malformations: Secondary | ICD-10-CM | POA: Insufficient documentation

## 2021-08-01 DIAGNOSIS — Z3A21 21 weeks gestation of pregnancy: Secondary | ICD-10-CM | POA: Insufficient documentation

## 2021-08-03 LAB — AFP, SERUM, OPEN SPINA BIFIDA
AFP MoM: 1.65
AFP Value: 141.5 ng/mL
Gest. Age on Collection Date: 21 weeks
Maternal Age At EDD: 29.7 yr
OSBR Risk 1 IN: 1848
Test Results:: NEGATIVE
Weight: 104 [lb_av]

## 2021-08-14 ENCOUNTER — Ambulatory Visit (INDEPENDENT_AMBULATORY_CARE_PROVIDER_SITE_OTHER): Payer: Self-pay | Admitting: Student

## 2021-08-14 VITALS — BP 92/58 | HR 98 | Wt 108.2 lb

## 2021-08-14 DIAGNOSIS — Z3480 Encounter for supervision of other normal pregnancy, unspecified trimester: Secondary | ICD-10-CM

## 2021-08-14 DIAGNOSIS — Z3A22 22 weeks gestation of pregnancy: Secondary | ICD-10-CM

## 2021-08-14 NOTE — Progress Notes (Signed)
       PRENATAL VISIT NOTE- Centering Pregnancy Cycle 3, Session # 5  Subjective:  Terri Kane is a 31 y.o. [id number] at [redacted]w[redacted]d being seen today for ongoing prenatal care through Centering Pregnancy.  She is currently monitored for the following issues for this low-risk pregnancy and has GBS bacteriuria and Supervision of other normal pregnancy, antepartum on their problem list.  Patient reports no complaints.  Contractions: Not present. Vag. Bleeding: None.  Movement: Present. Denies leaking of fluid/ROM.   The following portions of the patient's history were reviewed and updated as appropriate: allergies, current medications, past family history, past medical history, past social history, past surgical history and problem list. Problem list updated.  Objective:   Vitals:   08/14/21 0902  BP: (!) 92/58  Pulse: 98  Weight: 108 lb 3.2 oz (49.1 kg)    Fetal Status: Fetal Heart Rate (bpm): 168 Fundal Height: 24 cm Movement: Present     General:  Alert, oriented and cooperative. Patient is in no acute distress.  Skin: Skin is warm and dry. No rash noted.   Cardiovascular: Normal heart rate noted  Respiratory: Normal respiratory effort, no problems with respiration noted  Abdomen: Soft, gravid, appropriate for gestational age.  Pain/Pressure: Present     Pelvic: Cervical exam deferred        Extremities: Normal range of motion.     Mental Status: Normal mood and affect. Normal behavior. Normal judgment and thought content.   Assessment and Plan:  Pregnancy: [id number] at [redacted]w[redacted]d  1. Supervision of other normal pregnancy, antepartum   -wants BTL, waiting on information on cost.     Centering Pregnancy, Session#5: Reviewed resources in [employer].   Facilitated discussion today:  Stages of Labor, Sign of labor, labor positions, epidural  Fundal height and FHR appropriate today unless noted otherwise in plan. Patient to continue group care.     Preterm  labor symptoms and general obstetric precautions including but not limited to vaginal bleeding, contractions, leaking of fluid and fetal movement were reviewed in detail with the patient. Please refer to After Visit Summary for other counseling recommendations.  Return in about 2 weeks (around 08/28/2021) for Centering as scheduled.  Future Appointments  Date Time Provider Department Center  08/28/2021  9:00 AM CENTERING PROVIDER [facility]  09/11/2021  9:00 AM CENTERING PROVIDER [facility]  09/25/2021  9:00 AM CENTERING PROVIDER [facility]  10/03/2021  7:45 AM [facility] NURSE [facility]  10/03/2021  8:00 AM [facility]-M[facility] US1 [facility]  10/09/2021  9:00 AM CENTERING PROVIDER [facility]  10/23/2021  9:00 AM CENTERING PROVIDER [facility]    Marylene Land, CNM

## 2021-08-27 NOTE — Progress Notes (Unsigned)
       PRENATAL VISIT NOTE- Centering Pregnancy Cycle {NUMBERS:20191}, Session # {NUMBERS:20191}  Subjective:  Terri Kane is a 31 y.o. [id number] at [redacted]w[redacted]d being seen today for ongoing prenatal care through Centering Pregnancy.  She is currently monitored for the following issues for this {Blank single:19197::"high-risk","low-risk"} pregnancy and has GBS bacteriuria and Supervision of other normal pregnancy, antepartum on their problem list.  Patient reports {sx:[id number]}.   .  .   . ***Denies leaking of fluid/ROM.   The following portions of the patient's history were reviewed and updated as appropriate: allergies, current medications, past family history, past medical history, past social history, past surgical history and problem list. Problem list updated.  Objective:  There were no vitals filed for this visit.  Fetal Status:           General:  Alert, oriented and cooperative. Patient is in no acute distress.  Skin: Skin is warm and dry. No rash noted.   Cardiovascular: Normal heart rate noted  Respiratory: Normal respiratory effort, no problems with respiration noted  Abdomen: Soft, gravid, appropriate for gestational age.        Pelvic: {Blank single:19197::"Cervical exam performed","Cervical exam deferred"}        Extremities: Normal range of motion.     Mental Status: Normal mood and affect. Normal behavior. Normal judgment and thought content.   Assessment and Plan:  Pregnancy: [id number] at [redacted]w[redacted]d  There are no diagnoses linked to this encounter.  Centering Pregnancy, Session#6: Reviewed resources in [employer].  Facilitated discussion today: family planning/reproductive life planning, coping strategies   Mindfulness activity with positive affirmations   Fundal height and FHR appropriate today unless noted otherwise in plan. Patient to continue group care.     {Blank single:19197::"Term","Preterm"} labor symptoms and general obstetric precautions  including but not limited to vaginal bleeding, contractions, leaking of fluid and fetal movement were reviewed in detail with the patient. Please refer to After Visit Summary for other counseling recommendations.  No follow-ups on file.  Future Appointments  Date Time Provider Department Center  08/28/2021  9:00 AM CENTERING PROVIDER [facility]  09/11/2021  9:00 AM CENTERING PROVIDER [facility]  09/25/2021  9:00 AM CENTERING PROVIDER [facility]  10/03/2021  7:45 AM W[facility]-M[facility] NURSE [facility]  10/03/2021  8:00 AM [facility]-M[facility] US1 [facility]  10/09/2021  9:00 AM CENTERING PROVIDER [facility]  10/23/2021  9:00 AM CENTERING PROVIDER [facility]    Marylene Land, CNM

## 2021-08-28 ENCOUNTER — Ambulatory Visit (INDEPENDENT_AMBULATORY_CARE_PROVIDER_SITE_OTHER): Payer: Self-pay | Admitting: Student

## 2021-08-28 ENCOUNTER — Telehealth: Payer: Self-pay | Admitting: Student

## 2021-08-28 VITALS — BP 91/49 | HR 78 | Wt 109.8 lb

## 2021-08-28 DIAGNOSIS — Z3A24 24 weeks gestation of pregnancy: Secondary | ICD-10-CM

## 2021-08-28 DIAGNOSIS — Z3480 Encounter for supervision of other normal pregnancy, unspecified trimester: Secondary | ICD-10-CM

## 2021-08-28 NOTE — Telephone Encounter (Signed)
TC to patient to inform her of diabetes appt on 09/15/2021; patient verbalized understanding and had no further questions.

## 2021-08-28 NOTE — Addendum Note (Signed)
Addended by: Jill Side on: 08/28/2021 01:58 PM   Modules accepted: Orders

## 2021-09-15 ENCOUNTER — Other Ambulatory Visit: Payer: Self-pay

## 2021-09-15 DIAGNOSIS — Z3480 Encounter for supervision of other normal pregnancy, unspecified trimester: Secondary | ICD-10-CM

## 2021-09-16 LAB — CBC
Hematocrit: 32.2 % — ABNORMAL LOW (ref 34.0–46.6)
Hemoglobin: 10.6 g/dL — ABNORMAL LOW (ref 11.1–15.9)
MCH: 30.1 pg (ref 26.6–33.0)
MCHC: 32.9 g/dL (ref 31.5–35.7)
MCV: 92 fL (ref 79–97)
Platelets: 285 10*3/uL (ref 150–450)
RBC: 3.52 x10E6/uL — ABNORMAL LOW (ref 3.77–5.28)
RDW: 11.4 % — ABNORMAL LOW (ref 11.7–15.4)
WBC: 9.1 10*3/uL (ref 3.4–10.8)

## 2021-09-16 LAB — GLUCOSE TOLERANCE, 2 HOURS W/ 1HR
Glucose, 1 hour: 100 mg/dL (ref 70–179)
Glucose, 2 hour: 97 mg/dL (ref 70–152)
Glucose, Fasting: 79 mg/dL (ref 70–91)

## 2021-09-16 LAB — HIV ANTIBODY (ROUTINE TESTING W REFLEX): HIV Screen 4th Generation wRfx: NONREACTIVE

## 2021-09-16 LAB — RPR: RPR Ser Ql: NONREACTIVE

## 2021-09-18 ENCOUNTER — Inpatient Hospital Stay (HOSPITAL_COMMUNITY): Payer: Self-pay

## 2021-09-18 ENCOUNTER — Inpatient Hospital Stay (HOSPITAL_COMMUNITY)
Admission: AD | Admit: 2021-09-18 | Discharge: 2021-09-18 | Disposition: A | Discharge status: Home / Self Care | Payer: Self-pay | Attending: Obstetrics and Gynecology | Admitting: Obstetrics and Gynecology

## 2021-09-18 ENCOUNTER — Encounter (HOSPITAL_COMMUNITY): Payer: Self-pay | Admitting: Obstetrics and Gynecology

## 2021-09-18 DIAGNOSIS — Y9301 Activity, walking, marching and hiking: Secondary | ICD-10-CM | POA: Insufficient documentation

## 2021-09-18 DIAGNOSIS — M7989 Other specified soft tissue disorders: Secondary | ICD-10-CM | POA: Insufficient documentation

## 2021-09-18 DIAGNOSIS — R0602 Shortness of breath: Secondary | ICD-10-CM | POA: Insufficient documentation

## 2021-09-18 DIAGNOSIS — O9A212 Injury, poisoning and certain other consequences of external causes complicating pregnancy, second trimester: Secondary | ICD-10-CM | POA: Insufficient documentation

## 2021-09-18 DIAGNOSIS — O26892 Other specified pregnancy related conditions, second trimester: Secondary | ICD-10-CM | POA: Insufficient documentation

## 2021-09-18 DIAGNOSIS — M79672 Pain in left foot: Secondary | ICD-10-CM | POA: Insufficient documentation

## 2021-09-18 DIAGNOSIS — Z3480 Encounter for supervision of other normal pregnancy, unspecified trimester: Secondary | ICD-10-CM

## 2021-09-18 DIAGNOSIS — Z3A27 27 weeks gestation of pregnancy: Secondary | ICD-10-CM | POA: Insufficient documentation

## 2021-09-18 DIAGNOSIS — S93402A Sprain of unspecified ligament of left ankle, initial encounter: Secondary | ICD-10-CM | POA: Insufficient documentation

## 2021-09-18 DIAGNOSIS — K219 Gastro-esophageal reflux disease without esophagitis: Secondary | ICD-10-CM | POA: Insufficient documentation

## 2021-09-18 DIAGNOSIS — O99612 Diseases of the digestive system complicating pregnancy, second trimester: Secondary | ICD-10-CM | POA: Insufficient documentation

## 2021-09-18 HISTORY — DX: Gestational (pregnancy-induced) hypertension without significant proteinuria, unspecified trimester: O13.9

## 2021-09-18 MED ORDER — FAMOTIDINE 20 MG PO TABS: 20.0000 mg | ORAL_TABLET | Freq: Two times a day (BID) | ORAL | 0 refills | Status: DC

## 2021-09-18 NOTE — MAU Note (Addendum)
Terri Kane is a 31 y.o. at [redacted]w[redacted]d here in MAU reporting: called the clinic, regarding swelling in her feet.(Minimal noted) With the swelling, she has noted a 3cm mass in the lower instep on left foot., also on rt foot, but smaller.  Is painful.  When present and having pain, she feels short of breath.  (Says is currently SOB, thought no apparent distress, no flaring or use of accessory muscles, o2 sat remained 100% while talking in triage, did not need to pause to catch breath).  Denies vag bleeding or LOF, no abd pain, reports +FM  Onset of complaint: for a wk.  Pain score: 9 There were no vitals filed for this visit.   FHT:160 Lab orders placed from triage:  urine obtained, did not order   Twisted left ankle a couple wks ago

## 2021-09-18 NOTE — MAU Note (Signed)
Dr Annia Friendly in to help wrap ankle

## 2021-09-18 NOTE — Discharge Instructions (Addendum)
Apply Ice, and wrap with an Ace-wrap to help with pain OK to apply a little Aspercreme over the area  Aplique hielo y envulvalo con una venda Ace para ayudar con el dolor. Est bien aplicar un poco de Aspercreme sobre el rea

## 2021-09-18 NOTE — MAU Provider Note (Signed)
MAU Provider Note   History     [id number]  Arrival date and time: 09/18/21 1523    Chief Complaint  Patient presents with   Foot Swelling   Shortness of Breath    HPI Terri Kane is a 31 y.o. at [redacted]w[redacted]d by LMP with PMHx notable for possible hx of Pre-eclampsia - but with inconsistent hx, who presents for left foot pain and swelling and sob.   She twisted her left foot while walking about 3 weeks ago, and started to feel pain and swelling on the inside of her ankle and hind foot in the last 2 weeks. Symptoms have been worsening, she is having to limp while walking. No previous trauma to ankle. No swelling in contralateral ankle and foot. Has taken some tylenol but otherwise has not tried ice or anything else.   She describes shortness or breath that occurs when she is lying on her back. Tends to wake up feeling like "she can't breathe". Occurs when she lies on her side too, but seems less severe. Reports severe reflux that occurs with all food, hasn't tried any treatment for this yet.  Review of outside prenatal records from Office - yes. Faculty practice  merge into HPI Vaginal bleeding: No LOF: No Fetal Movement: Yes Contractions: No  O/Positive/-- (03/23 1527)  OB History     Gravida  4   Para  2   Term  2   Preterm      AB  1   Living  2      SAB  1   IAB      Ectopic      Multiple      Live Births  2        Obstetric Comments  2013 HTN         Past Medical History:  Diagnosis Date   Depression    feeling better   Headache    Hypotension    Ovarian cyst    Pregnancy induced hypertension    UTI (urinary tract infection)     Past Surgical History:  Procedure Laterality Date   NO PAST SURGERIES      Family History  Problem Relation Age of Onset   Healthy Mother    Migraines Mother    Healthy Father     Social History   Socioeconomic History   Marital status: Single    Spouse name: Not on file   Number of  children: Not on file   Years of education: Not on file   Highest education level: Not on file  Occupational History   Not on file  Tobacco Use   Smoking status: Never   Smokeless tobacco: Never  Vaping Use   Vaping Use: Never used  Substance and Sexual Activity   Alcohol use: Not Currently   Drug use: Never   Sexual activity: Not Currently    Birth control/protection: None  Other Topics Concern   Not on file  Social History Narrative   Not on file   Social Determinants of Health   Financial Resource Strain: Not on file  Food Insecurity: Food Insecurity Present (08/14/2021)   Hunger Vital Sign    Worried About Running Out of Food in the Last Year: Sometimes true    Ran Out of Food in the Last Year: Sometimes true  Transportation Needs: Unmet Transportation Needs (07/03/2021)   PRAPARE - Transportation    Lack of Transportation (Medical): No    Lack of  Transportation (Non-Medical): Yes  Physical Activity: Not on file  Stress: Not on file  Social Connections: Not on file  Intimate Partner Violence: Not on file    No Known Allergies  No current facility-administered medications on file prior to encounter.   Current Outpatient Medications on File Prior to Encounter  Medication Sig Dispense Refill   acetaminophen (TYLENOL) 500 MG tablet Take 1,000 mg by mouth every 6 (six) hours as needed.     aspirin EC 81 MG tablet Take 1 tablet (81 mg total) by mouth daily. Swallow whole. 90 tablet 2   Prenatal Vit-Fe Fumarate-FA (MULTIVITAMIN-PRENATAL) 27-0.8 MG TABS tablet Take 1 tablet by mouth daily at 12 noon.       Review of Systems  Constitutional:  Negative for malaise/fatigue.  Respiratory:  Negative for cough and wheezing.   Cardiovascular:  Negative for chest pain.  Gastrointestinal:  Positive for heartburn. Negative for constipation, diarrhea, nausea and vomiting.  Genitourinary:  Negative for dysuria.  Musculoskeletal:  Positive for joint pain.  Neurological:  Negative  for dizziness, weakness and headaches.   Pertinent positives and negative per HPI, all others reviewed and negative  Physical Exam   BP 98/71 (BP Location: Right Arm)   Pulse (!) 101   Temp 98 F (36.7 C) (Oral)   Resp 16   Ht 4' 11.06" (1.5 m)   Wt 51.1 kg   LMP 03/07/2021   SpO2 100%   BMI 22.70 kg/m   Normal FHR of 160bpm  Patient Vitals for the past 24 hrs:  BP Temp Temp src Pulse Resp SpO2 Height Weight  09/18/21 1915 98/71 -- -- (!) 101 -- -- -- --  09/18/21 1800 108/70 -- -- 99 -- -- -- --  09/18/21 1549 111/73 98 F (36.7 C) Oral (!) 103 16 100 % 4' 11.06" (1.5 m) 51.1 kg    Physical Exam Vitals reviewed.  Constitutional:      General: She is not in acute distress.    Appearance: She is well-developed. She is not toxic-appearing.  HENT:     Head: Normocephalic and atraumatic.     Mouth/Throat:     Mouth: Mucous membranes are moist.  Eyes:     Extraocular Movements: Extraocular movements intact.  Cardiovascular:     Rate and Rhythm: Normal rate.  Pulmonary:     Effort: Pulmonary effort is normal. No accessory muscle usage or respiratory distress.     Breath sounds: Normal breath sounds.  Abdominal:     Palpations: Abdomen is soft.     Comments: gravid  Musculoskeletal:     Right lower leg: Normal.     Left lower leg: Tenderness (++tenderness to medial ankle, posterior to medial malleolus and to hind foot. localized swelling with mild ecchymosis noted.) present. No deformity.     Comments: Tenderness along distal tibia around ankle as well. Able to bear weight but with limp present. No pitting edema in BLE. + PT and DP pulses bilaterally. Full ROM at ankle joint but painful with supination/pronation. 5/5 strength through ankle and knee. No tenderness with palpation of fibula.   Skin:    General: Skin is warm and dry.  Neurological:     Mental Status: She is alert and oriented to person, place, and time.  Psychiatric:        Mood and Affect: Mood normal.         Behavior: Behavior normal.     Cervical Exam  Deferred  Bedside Ultrasound Not indicated  Labs No results found for this or any previous visit (from the past 24 hour(s)).  Imaging DG Ankle 2 Views Left  Result Date: 09/18/2021 CLINICAL DATA:  Foot and ankle pain EXAM: LEFT ANKLE - 2 VIEW COMPARISON:  None Available. FINDINGS: There is no evidence of fracture, dislocation, or joint effusion. There is no evidence of arthropathy or other focal bone abnormality. Soft tissues are unremarkable. IMPRESSION: Negative. Electronically Signed   By: Jasmine Pang M.D.   On: 09/18/2021 18:18   DG Foot 2 Views Left  Result Date: 09/18/2021 CLINICAL DATA:  Foot and ankle pain EXAM: LEFT FOOT - 2 VIEW COMPARISON:  None Available. FINDINGS: There is no evidence of fracture or dislocation. There is no evidence of arthropathy or other focal bone abnormality. Soft tissues are unremarkable. IMPRESSION: Negative. Electronically Signed   By: Jasmine Pang M.D.   On: 09/18/2021 18:18    MAU Course  Procedures Lab Orders  No laboratory test(s) ordered today   Meds ordered this encounter  Medications   famotidine (PEPCID) 20 MG tablet    Sig: Take 1 tablet (20 mg total) by mouth 2 (two) times daily.    Dispense:  60 tablet    Refill:  0   Imaging Orders         DG Foot 2 Views Left         DG Ankle 2 Views Left      Assessment and Plan   1. Supervision of other normal pregnancy, antepartum 2. [redacted] weeks gestation of pregnancy  3. Sprain of left ankle, unspecified ligament, initial encounter Exam findings suggest sprain involving left ankle/foot. Xray done today and shows no signs of fracture. Recommend compression, ice, elevation and OTC topical analgesia like Aspercreme, In addition to Tylenol 1000mg  q8hrs prn. - Discharge patient  4. Gastroesophageal reflux during pregnancy in second trimester, antepartum Suspect shortness or breath may be due to Reflux symptoms, vs compression of IVC,  given background history of low BP. No ongoing shortness or breath during my evaluation. No concerns for possible pneumonia or background lung dx. - Recommend avoiding lying supine, can favor left lateral position, also consider using 2-3 pillows to allow for some recline. - Take pepcid 1 tablet nightly, scheduled; avoid late night meals, limit spicy food, tomato juice and other foods that may worsen symptoms.   Dispo: discharged to home in stable condition.  [place] , MD/MPH 09/18/21 8:42 PM  Allergies as of 09/18/2021   No Known Allergies      Medication List     STOP taking these medications    cyclobenzaprine 10 MG tablet Commonly known as: FLEXERIL   promethazine 25 MG tablet Commonly known as: PHENERGAN   terconazole 0.4 % vaginal cream Commonly known as: TERAZOL 7       TAKE these medications    acetaminophen 500 MG tablet Commonly known as: TYLENOL Take 1,000 mg by mouth every 6 (six) hours as needed.   aspirin EC 81 MG tablet Take 1 tablet (81 mg total) by mouth daily. Swallow whole.   famotidine 20 MG tablet Commonly known as: PEPCID Take 1 tablet (20 mg total) by mouth 2 (two) times daily.   multivitamin-prenatal 27-0.8 MG Tabs tablet Take 1 tablet by mouth daily at 12 noon.       GME ATTESTATION:  I saw and evaluated the patient. I agree with the findings and the plan of care as documented in the fellow's note.  09/20/2021, DO OB  Fellow, [place] [facility] [place] 09/18/2021 8:52 PM

## 2021-09-24 NOTE — Progress Notes (Deleted)
       PRENATAL VISIT NOTE- Centering Pregnancy Cycle {NUMBERS:20191}, Session # {NUMBERS:20191}  Subjective:  Terri Kane is a 31 y.o. [id number] at [redacted]w[redacted]d being seen today for ongoing prenatal care through Centering Pregnancy.  She is currently monitored for the following issues for this {Blank single:19197::"high-risk","low-risk"} pregnancy and has GBS bacteriuria and Supervision of other normal pregnancy, antepartum on their problem list.  Patient reports {sx:14538}.   .  .   . ***Denies leaking of fluid/ROM.   The following portions of the patient's history were reviewed and updated as appropriate: allergies, current medications, past family history, past medical history, past social history, past surgical history and problem list. Problem list updated.  Objective:  There were no vitals filed for this visit.  Fetal Status:           General:  Alert, oriented and cooperative. Patient is in no acute distress.  Skin: Skin is warm and dry. No rash noted.   Cardiovascular: Normal heart rate noted  Respiratory: Normal respiratory effort, no problems with respiration noted  Abdomen: Soft, gravid, appropriate for gestational age.        Pelvic: {Blank single:19197::"Cervical exam performed","Cervical exam deferred"}        Extremities: Normal range of motion.     Mental Status: Normal mood and affect. Normal behavior. Normal judgment and thought content.   Assessment and Plan:  Pregnancy: [id number] at [redacted]w[redacted]d   Centering Pregnancy, Session#9: Reviewed resources in [employer].   Facilitated discussion today:  baby safety around the home, demonstration of soothing techniques for infants Mindfulness activity with visualization of a "special place" as well as relaxation breathing when thinking about fears or unexpected outcomes for childbirth preparation  Fundal height and FHR appropriate today unless noted otherwise in plan. Patient to continue group care.      {Blank single:19197::"Term","Preterm"} labor symptoms and general obstetric precautions including but not limited to vaginal bleeding, contractions, leaking of fluid and fetal movement were reviewed in detail with the patient. Please refer to After Visit Summary for other counseling recommendations.  No follow-ups on file.  Future Appointments  Date Time Provider Department Center  09/25/2021  9:00 AM CENTERING PROVIDER [facility]  10/03/2021  7:45 AM [facility] NURSE [facility]  10/03/2021  8:00 AM [facility] US1 [facility]  10/09/2021  9:00 AM CENTERING PROVIDER [facility]  10/23/2021  9:00 AM CENTERING PROVIDER [facility]  11/06/2021  9:00 AM CENTERING PROVIDER [facility]  11/20/2021  9:00 AM CENTERING PROVIDER [facility]  12/04/2021  9:00 AM CENTERING PROVIDER [facility]    Marylene Land, CNM

## 2021-09-25 ENCOUNTER — Telehealth: Payer: Self-pay | Admitting: Student

## 2021-09-25 NOTE — Telephone Encounter (Signed)
TC to patient to discuss missing last two centering appts; no answer and vmail not to set up.  Will try again later today.  Luna Kitchens

## 2021-09-25 NOTE — Telephone Encounter (Signed)
TC to patient; she states she did not come to group because she has sprained her ankle and cannot walk well-she was told to stay home and rest. She plans to be at the appt on the 08/10.   Samara Deist

## 2021-10-03 ENCOUNTER — Other Ambulatory Visit: Payer: Self-pay | Admitting: *Deleted

## 2021-10-03 ENCOUNTER — Encounter: Payer: Self-pay | Admitting: *Deleted

## 2021-10-03 ENCOUNTER — Ambulatory Visit: Payer: Self-pay | Admitting: *Deleted

## 2021-10-03 ENCOUNTER — Ambulatory Visit: Payer: Self-pay | Attending: Obstetrics

## 2021-10-03 VITALS — BP 104/64 | HR 92

## 2021-10-03 DIAGNOSIS — Z3689 Encounter for other specified antenatal screening: Secondary | ICD-10-CM | POA: Insufficient documentation

## 2021-10-03 DIAGNOSIS — O09293 Supervision of pregnancy with other poor reproductive or obstetric history, third trimester: Secondary | ICD-10-CM

## 2021-10-03 DIAGNOSIS — O09299 Supervision of pregnancy with other poor reproductive or obstetric history, unspecified trimester: Secondary | ICD-10-CM

## 2021-10-03 DIAGNOSIS — Z3A30 30 weeks gestation of pregnancy: Secondary | ICD-10-CM

## 2021-10-05 NOTE — Progress Notes (Signed)
       PRENATAL VISIT NOTE- Centering Pregnancy Cycle 3, Session # 9 using session 8 curriculum.   Subjective:  Lisaann Aneita Kiger is a 31 y.o. [phone] at [redacted]w[redacted]d being seen today for ongoing prenatal care through Centering Pregnancy.  She is currently monitored for the following issues for this low-risk pregnancy and has GBS bacteriuria and Supervision of other normal pregnancy, antepartum on their problem list.  Patient reports no complaints.   . Vag. Bleeding: None.  Movement: Present. Denies leaking of fluid/ROM.   The following portions of the patient's history were reviewed and updated as appropriate: allergies, current medications, past family history, past medical history, past social history, past surgical history and problem list. Problem list updated.  Objective:   Vitals:   10/09/21 1316  BP: (!) 92/55  Pulse: (!) 114  Weight: 114 lb 9.6 oz (52 kg)    Fetal Status: Fetal Heart Rate (bpm): 154 Fundal Height: 28 cm Movement: Present     General:  Alert, oriented and cooperative. Patient is in no acute distress.  Skin: Skin is warm and dry. No rash noted.   Cardiovascular: Normal heart rate noted  Respiratory: Normal respiratory effort, no problems with respiration noted  Abdomen: Soft, gravid, appropriate for gestational age.        Pelvic: Cervical exam deferred        Extremities: Normal range of motion.     Mental Status: Normal mood and affect. Normal behavior. Normal judgment and thought content.   Assessment and Plan:  Pregnancy: [id number] at [redacted]w[redacted]d      Centering Pregnancy, Session#9: Reviewed resources in [employer].   Facilitated discussion today:  Visit by Hulda Marin to talk about perintal mood disorders, services offered at [facility], discussed newborn safety, and reviewed GDM testing and management  Fundal height and FHR appropriate today unless noted otherwise in plan. Patient to continue group care.   -wants BTL but does not have  insurance, gave range of quotes for patient; she will consider and do Nexplanon at [facility]  -refill given for antacid   Preterm labor symptoms and general obstetric precautions including but not limited to vaginal bleeding, contractions, leaking of fluid and fetal movement were reviewed in detail with the patient. Please refer to After Visit Summary for other counseling recommendations.  Return in about 2 weeks (around 10/23/2021) for Centering as scheduled.  Future Appointments  Date Time Provider Department Center  10/23/2021  9:00 AM CENTERING PROVIDER [facility]  11/06/2021  9:00 AM CENTERING PROVIDER [facility]  11/07/2021  7:45 AM [facility] NURSE [facility]  11/07/2021  8:00 AM [facility] US1 [facility]  11/20/2021  9:00 AM CENTERING PROVIDER [facility]  12/04/2021  9:00 AM CENTERING PROVIDER [facility]    Marylene Land, CNM

## 2021-10-09 ENCOUNTER — Ambulatory Visit (INDEPENDENT_AMBULATORY_CARE_PROVIDER_SITE_OTHER): Payer: Self-pay | Admitting: Student

## 2021-10-09 VITALS — BP 92/55 | HR 114 | Wt 114.6 lb

## 2021-10-09 DIAGNOSIS — Z3A30 30 weeks gestation of pregnancy: Secondary | ICD-10-CM

## 2021-10-09 DIAGNOSIS — Z3480 Encounter for supervision of other normal pregnancy, unspecified trimester: Secondary | ICD-10-CM

## 2021-10-09 DIAGNOSIS — Z23 Encounter for immunization: Secondary | ICD-10-CM

## 2021-10-09 DIAGNOSIS — Z3483 Encounter for supervision of other normal pregnancy, third trimester: Secondary | ICD-10-CM

## 2021-10-09 MED ORDER — FAMOTIDINE 20 MG PO TABS: 20.0000 mg | ORAL_TABLET | Freq: Two times a day (BID) | ORAL | 0 refills | Status: DC

## 2021-10-09 MED ORDER — TETANUS-DIPHTH-ACELL PERTUSSIS 5-2.5-18.5 LF-MCG/0.5 IM SUSY
0.5000 mL | PREFILLED_SYRINGE | Freq: Once | INTRAMUSCULAR | Status: AC
  Administered 2021-10-09: 0.5 mL via INTRAMUSCULAR

## 2021-10-20 ENCOUNTER — Telehealth: Payer: Self-pay | Admitting: Family Medicine

## 2021-10-20 NOTE — Telephone Encounter (Signed)
Patient called spoke with interpreter " Byrd Hesselbach"  Patient was having contractions yesterday every 10 to 15 mins, said they stopped at midnight

## 2021-10-20 NOTE — Telephone Encounter (Signed)
Call placed to pt with [employer] interpreter Thyra Breed # [phone]. Spoke with pt. Pt states having braxton hicks contractions since yesterday and coming every 10 mins since yesterday. States baby moving well. Denies any vaginal bleeding or discharge. Pt states eating and drinking well every day. Has increased water, but has not taken any Tylenol. Pt advised can take some OTC Tylenol to help with discomfort or take a warm bath. Denies any water leaking.  Pt has next OB appt on 8/24 for Centering group. All questions answered.  Judeth Cornfield, RNC

## 2021-10-23 ENCOUNTER — Ambulatory Visit (INDEPENDENT_AMBULATORY_CARE_PROVIDER_SITE_OTHER): Payer: Self-pay | Admitting: Student

## 2021-10-23 VITALS — BP 94/58 | HR 89 | Wt 116.0 lb

## 2021-10-23 DIAGNOSIS — Z3A32 32 weeks gestation of pregnancy: Secondary | ICD-10-CM

## 2021-10-23 DIAGNOSIS — Z3480 Encounter for supervision of other normal pregnancy, unspecified trimester: Secondary | ICD-10-CM

## 2021-10-23 NOTE — Progress Notes (Signed)
       PRENATAL VISIT NOTE- Centering Pregnancy Cycle 3 Session # 10  Subjective:  Terri Kane is a 31 y.o. [id number] at [redacted]w[redacted]d being seen today for ongoing prenatal care through Centering Pregnancy.  She is currently monitored for the following issues for this low-risk pregnancy and has GBS bacteriuria and Supervision of other normal pregnancy, antepartum on their problem list.  Patient reports  occasional contractions and HA that goes away with tylenol. This is an ongoing issue for her.  .  Contractions: Irregular. Vag. Bleeding: None.  Movement: Present. Denies leaking of fluid/ROM.   The following portions of the patient's history were reviewed and updated as appropriate: allergies, current medications, past family history, past medical history, past social history, past surgical history and problem list. Problem list updated.  Objective:   Vitals:   10/23/21 1204  BP: (!) 94/58  Pulse: 89  Weight: 116 lb (52.6 kg)    Fetal Status: Fetal Heart Rate (bpm): 132 Fundal Height: 32 cm Movement: Present     General:  Alert, oriented and cooperative. Patient is in no acute distress.  Skin: Skin is warm and dry. No rash noted.   Cardiovascular: Normal heart rate noted  Respiratory: Normal respiratory effort, no problems with respiration noted  Abdomen: Soft, gravid, appropriate for gestational age.  Pain/Pressure: Present     Pelvic: Cervical exam deferred        Extremities: Normal range of motion.     Mental Status: Normal mood and affect. Normal behavior. Normal judgment and thought content.   Assessment and Plan:  Pregnancy: [id number] at [redacted]w[redacted]d  1. Supervision of other normal pregnancy, antepartum -reviewed signs of labor, when to come to hospital   Centering Pregnancy, Session#10: Reviewed resources in [employer].   Facilitated discussion today:  PP and newborn appearence, when to call MD or peds, pericare, tummy time, newborn development, Shaken baby  syndrome  Fundal height and FHR appropriate today unless noted otherwise in plan. Patient to continue group care.    Preterm labor symptoms and general obstetric precautions including but not limited to vaginal bleeding, contractions, leaking of fluid and fetal movement were reviewed in detail with the patient. Please refer to After Visit Summary for other counseling recommendations.  No follow-ups on file.  Future Appointments  Date Time Provider Department Center  11/06/2021  9:00 AM CENTERING PROVIDER [facility]  11/07/2021  7:45 AM [facility] NURSE [facility]  11/07/2021  8:00 AM [facility] US1 [facility]  11/20/2021  9:00 AM CENTERING PROVIDER [facility]  11/27/2021  9:15 AM Hermina Staggers, MD [facility]  12/04/2021  9:00 AM CENTERING PROVIDER [facility]  12/11/2021  8:55 AM Carlynn Herald, CNM [facility]    Marylene Land, CNM

## 2021-11-06 ENCOUNTER — Encounter: Payer: Self-pay | Admitting: Student

## 2021-11-07 ENCOUNTER — Ambulatory Visit: Payer: MEDICAID | Attending: Obstetrics and Gynecology

## 2021-11-07 ENCOUNTER — Ambulatory Visit: Payer: Self-pay | Admitting: *Deleted

## 2021-11-07 ENCOUNTER — Encounter: Payer: Self-pay | Admitting: *Deleted

## 2021-11-07 VITALS — BP 103/59 | HR 79

## 2021-11-07 DIAGNOSIS — Z3480 Encounter for supervision of other normal pregnancy, unspecified trimester: Secondary | ICD-10-CM

## 2021-11-07 DIAGNOSIS — O09293 Supervision of pregnancy with other poor reproductive or obstetric history, third trimester: Secondary | ICD-10-CM | POA: Insufficient documentation

## 2021-11-07 DIAGNOSIS — O09299 Supervision of pregnancy with other poor reproductive or obstetric history, unspecified trimester: Secondary | ICD-10-CM

## 2021-11-07 DIAGNOSIS — Z3A35 35 weeks gestation of pregnancy: Secondary | ICD-10-CM | POA: Insufficient documentation

## 2021-11-13 ENCOUNTER — Encounter (HOSPITAL_COMMUNITY): Payer: Self-pay | Admitting: Obstetrics & Gynecology

## 2021-11-13 ENCOUNTER — Inpatient Hospital Stay (HOSPITAL_COMMUNITY)
Admission: AD | Admit: 2021-11-13 | Discharge: 2021-11-14 | Disposition: A | Discharge status: Home / Self Care | Payer: Medicaid Other | Attending: Obstetrics & Gynecology | Admitting: Obstetrics & Gynecology

## 2021-11-13 ENCOUNTER — Telehealth: Payer: Self-pay | Admitting: General Practice

## 2021-11-13 DIAGNOSIS — Z0371 Encounter for suspected problem with amniotic cavity and membrane ruled out: Secondary | ICD-10-CM | POA: Diagnosis present

## 2021-11-13 DIAGNOSIS — O2343 Unspecified infection of urinary tract in pregnancy, third trimester: Secondary | ICD-10-CM | POA: Insufficient documentation

## 2021-11-13 DIAGNOSIS — O4700 False labor before 37 completed weeks of gestation, unspecified trimester: Secondary | ICD-10-CM

## 2021-11-13 DIAGNOSIS — O99613 Diseases of the digestive system complicating pregnancy, third trimester: Secondary | ICD-10-CM | POA: Insufficient documentation

## 2021-11-13 DIAGNOSIS — O219 Vomiting of pregnancy, unspecified: Secondary | ICD-10-CM

## 2021-11-13 DIAGNOSIS — K219 Gastro-esophageal reflux disease without esophagitis: Secondary | ICD-10-CM | POA: Insufficient documentation

## 2021-11-13 DIAGNOSIS — N898 Other specified noninflammatory disorders of vagina: Secondary | ICD-10-CM

## 2021-11-13 DIAGNOSIS — Z3A36 36 weeks gestation of pregnancy: Secondary | ICD-10-CM | POA: Insufficient documentation

## 2021-11-13 DIAGNOSIS — Z3480 Encounter for supervision of other normal pregnancy, unspecified trimester: Secondary | ICD-10-CM

## 2021-11-13 DIAGNOSIS — O4703 False labor before 37 completed weeks of gestation, third trimester: Secondary | ICD-10-CM | POA: Diagnosis not present

## 2021-11-13 DIAGNOSIS — O212 Late vomiting of pregnancy: Secondary | ICD-10-CM | POA: Diagnosis not present

## 2021-11-13 DIAGNOSIS — B999 Unspecified infectious disease: Secondary | ICD-10-CM | POA: Diagnosis not present

## 2021-11-13 DIAGNOSIS — R8271 Bacteriuria: Secondary | ICD-10-CM | POA: Diagnosis not present

## 2021-11-13 LAB — LIPASE, BLOOD: Lipase: 26 U/L (ref 11–51)

## 2021-11-13 LAB — COMPREHENSIVE METABOLIC PANEL
ALT: 22 U/L (ref 0–44)
AST: 18 U/L (ref 15–41)
Albumin: 2.5 g/dL — ABNORMAL LOW (ref 3.5–5.0)
Alkaline Phosphatase: 160 U/L — ABNORMAL HIGH (ref 38–126)
Anion gap: 9 (ref 5–15)
BUN: <5 mg/dL — ABNORMAL LOW (ref 6–20)
CO2: 23 mmol/L (ref 22–32)
Calcium: 9.3 mg/dL (ref 8.9–10.3)
Chloride: 107 mmol/L (ref 98–111)
Creatinine, Ser: 0.68 mg/dL (ref 0.44–1.00)
GFR, Estimated: >60 mL/min (ref >60)
Glucose, Bld: 91 mg/dL (ref 70–99)
Potassium: 3.8 mmol/L (ref 3.5–5.1)
Sodium: 139 mmol/L (ref 135–145)
Total Bilirubin: 0.4 mg/dL (ref 0.3–1.2)
Total Protein: 5.9 g/dL — ABNORMAL LOW (ref 6.5–8.1)

## 2021-11-13 LAB — CBC
HCT: 28.9 % — ABNORMAL LOW (ref 36.0–46.0)
Hemoglobin: 9.5 g/dL — ABNORMAL LOW (ref 12.0–15.0)
MCH: 28.4 pg (ref 26.0–34.0)
MCHC: 32.9 g/dL (ref 30.0–36.0)
MCV: 86.5 fL (ref 80.0–100.0)
Platelets: 244 10*3/uL (ref 150–400)
RBC: 3.34 MIL/uL — ABNORMAL LOW (ref 3.87–5.11)
RDW: 12.8 % (ref 11.5–15.5)
WBC: 10 10*3/uL (ref 4.0–10.5)
nRBC: 0 % (ref 0.0–0.2)

## 2021-11-13 LAB — URINALYSIS, ROUTINE W REFLEX MICROSCOPIC
Bilirubin Urine: NEGATIVE
Glucose, UA: NEGATIVE mg/dL
Hgb urine dipstick: NEGATIVE
Ketones, ur: NEGATIVE mg/dL
Nitrite: NEGATIVE
Protein, ur: NEGATIVE mg/dL
Specific Gravity, Urine: 1.009 (ref 1.005–1.030)
pH: 8 (ref 5.0–8.0)

## 2021-11-13 LAB — AMYLASE: Amylase: 109 U/L — ABNORMAL HIGH (ref 28–100)

## 2021-11-13 LAB — WET PREP, GENITAL
Clue Cells Wet Prep HPF POC: NONE SEEN
Sperm: NONE SEEN
Trich, Wet Prep: NONE SEEN
WBC, Wet Prep HPF POC: >=10 — AB (ref <10)
Yeast Wet Prep HPF POC: NONE SEEN

## 2021-11-13 LAB — POCT FERN TEST: POCT Fern Test: NEGATIVE

## 2021-11-13 MED ORDER — FAMOTIDINE IN NACL 20-0.9 MG/50ML-% IV SOLN
20.0000 mg | Freq: Once | INTRAVENOUS | Status: AC
  Administered 2021-11-13: 20 mg via INTRAVENOUS
  Filled 2021-11-13: qty 50

## 2021-11-13 MED ORDER — DIPHENHYDRAMINE HCL 50 MG/ML IJ SOLN
12.5000 mg | Freq: Once | INTRAMUSCULAR | Status: AC
  Administered 2021-11-13: 12.5 mg via INTRAVENOUS
  Filled 2021-11-13: qty 1

## 2021-11-13 MED ORDER — CALCIUM CARBONATE ANTACID 500 MG PO CHEW
400.0000 mg | CHEWABLE_TABLET | Freq: Once | ORAL | Status: AC
  Administered 2021-11-13: 400 mg via ORAL
  Filled 2021-11-13: qty 2

## 2021-11-13 MED ORDER — LACTATED RINGERS IV SOLN: Freq: Once | INTRAVENOUS | Status: DC

## 2021-11-13 MED ORDER — ONDANSETRON 4 MG PO TBDP
4.0000 mg | ORAL_TABLET | Freq: Once | ORAL | Status: AC
Start: 2021-11-13 — End: 2021-11-13
  Administered 2021-11-13: 4 mg via ORAL
  Filled 2021-11-13: qty 1

## 2021-11-13 MED ORDER — SODIUM CHLORIDE 0.9 % IV SOLN
2.0000 g | Freq: Once | INTRAVENOUS | Status: AC
  Administered 2021-11-13: 2 g via INTRAVENOUS
  Filled 2021-11-13: qty 20

## 2021-11-13 MED ORDER — SODIUM CHLORIDE 0.9 % IV SOLN: Freq: Once | INTRAVENOUS | Status: AC

## 2021-11-13 MED ORDER — METOCLOPRAMIDE HCL 5 MG/ML IJ SOLN
10.0000 mg | Freq: Once | INTRAMUSCULAR | Status: AC
  Administered 2021-11-13: 10 mg via INTRAVENOUS
  Filled 2021-11-13: qty 2

## 2021-11-13 NOTE — Telephone Encounter (Signed)
Patient called into front office stating she has been having contractions & lower back pain all week. Patient states yesterday she started to have a lot of white discharge. Asked patient how often she is having contractions and she states she doesn't know but they happen a lot. Asked patient if she was having more than 5 contractions in a hour and she states yes. Patient reports she is unable to talk through contractions and rates her pain at a 8. Asked patient about her discharge and she denies itching or odor but reports a lot of it. Discussed with patient it sounds most likely to be normal discharge of pregnancy. Discussed symptoms of water breaking to patient and what to watch out for. Patient then states that she has been having leaking of fluid since yesterday. Advised patient to go to MAU for evaluation. Eda assisted with spanish interpretation.

## 2021-11-13 NOTE — MAU Provider Note (Cosign Needed)
History     CSN: [id number]  Arrival date and time: 11/13/21 1829   None     Chief Complaint  Patient presents with   Rupture of Membranes   Abdominal Pain   HPI Terri Kane is a 31 y.o. [id number] at [redacted]w[redacted]d who presents to MAU for possible leaking fluid and contractions. Patient reports contractions started yesterday and are about every 10 minutes. She tried taking Tylenol, last took at 3pm, but did not help. She is also reporting some white, watery discharge that started yesterday. She has had to wear a pantyliner and change it 3 times today. She denies itching or odor. No vaginal bleeding or urinary s/s. She reports normal active fetal movement.   Patient is a [facility] patient and next appointment is scheduled on 9/21.  OB History     Gravida  4   Para  2   Term  2   Preterm      AB  1   Living  2      SAB  1   IAB      Ectopic      Multiple      Live Births  2        Obstetric Comments  2013 HTN         Past Medical History:  Diagnosis Date   Depression    feeling better   Headache    Hypotension    Ovarian cyst    Pregnancy induced hypertension    UTI (urinary tract infection)     Past Surgical History:  Procedure Laterality Date   NO PAST SURGERIES      Family History  Problem Relation Age of Onset   Healthy Mother    Migraines Mother    Healthy Father     Social History   Tobacco Use   Smoking status: Never   Smokeless tobacco: Never  Vaping Use   Vaping Use: Never used  Substance Use Topics   Alcohol use: Not Currently   Drug use: Never    Allergies: No Known Allergies  Medications Prior to Admission  Medication Sig Dispense Refill Last Dose   acetaminophen (TYLENOL) 500 MG tablet Take 1,000 mg by mouth every 6 (six) hours as needed.   11/13/2021 at 1500   famotidine (PEPCID) 20 MG tablet Take 1 tablet (20 mg total) by mouth 2 (two) times daily. 60 tablet 0 11/13/2021   Prenatal Vit-Fe  Fumarate-FA (MULTIVITAMIN-PRENATAL) 27-0.8 MG TABS tablet Take 1 tablet by mouth daily at 12 noon.   11/13/2021   aspirin EC 81 MG tablet Take 1 tablet (81 mg total) by mouth daily. Swallow whole. 90 tablet 2     Review of Systems  Constitutional: Negative.   Respiratory: Negative.    Cardiovascular: Negative.   Gastrointestinal:  Positive for abdominal pain (contraction).  Genitourinary:  Positive for vaginal discharge. Negative for dysuria and vaginal bleeding.  Musculoskeletal:  Positive for back pain.  Neurological: Negative.    Physical Exam   Blood pressure 105/67, pulse 79, temperature 98 F (36.7 C), resp. rate 17, height 4\' 11"  (1.499 m), weight 53.5 kg, last menstrual period 03/07/2021, SpO2 100 %.  Physical Exam Vitals and nursing note reviewed. Exam conducted with a chaperone present.  Constitutional:      General: She is not in acute distress. Eyes:     Extraocular Movements: Extraocular movements intact.     Pupils: Pupils are equal, round, and reactive to light.  Cardiovascular:     Rate and Rhythm: Normal rate.  Pulmonary:     Effort: Pulmonary effort is normal.  Abdominal:     Palpations: Abdomen is soft.     Tenderness: There is no abdominal tenderness.     Comments: Gravid   Genitourinary:    Comments: Normal external female genitalia, vaginal walls pink with rugae, no pooling of amniotic fluid, no bleeding, copious amounts of thick white/yellow discharge coating cervix and vaginal walls c/w yeast, cervix visually 1cm without lesions/masses Musculoskeletal:        General: Normal range of motion.     Cervical back: Normal range of motion.  Skin:    General: Skin is warm and dry.  Neurological:     General: No focal deficit present.     Mental Status: She is alert and oriented to person, place, and time.  Psychiatric:        Mood and Affect: Mood normal.        Behavior: Behavior normal.        Judgment: Judgment normal.   Dilation: 2 Effacement (%):  Thick Exam by:: Camelia Eng, CNM  Fern: negative  NST FHR: 145 bpm, moderate variability, +15x15 accels, no decels Toco: Q 4-8 with ui   MAU Course  Procedures  MDM UA Wet prep, GC/CT pending Fern slide NST Zofran and Pepcid  Fern slide negative. Wet prep negative, however given exam showing discharge c/w yeast, will offer treatment. Patient given Zofran and Pepcid for nausea/heartburn. NST reactive and reassuring for gestational age. Plan to recheck cervix 1 hour after first check.   Care handed over to Wynelle Bourgeois, CNM at 2100   Brand Males, CNM 11/13/21, 8:58 PM  Assumed care: Patient vomiting and c/o acid coming up IV started for rehydration Rocephin loading dose 2gm given for UTI Urine to culture Reglan and Benadryl given which did help vomiting Pepcid infused Scopolamine patch applied.  Felt much better after therapy completed, able to tolerate PO intake Assessment and Plan  A:   Single IUP at [redacted]w[redacted]d        Uterine contractions, not in labor        Vaginal discharge         Nausea and vomiting        UTI in pregnancy        GERD  P:   discharge home        Rx Zofran for nausea         Keep Scop patch on 3 days        Rx Duricef for UTI        Labor precautions       Encouraged to return if she develops worsening of symptoms, increase in pain, fever, or other concerning symptoms.   Aviva Signs, CNM

## 2021-11-13 NOTE — MAU Note (Signed)
Pt wants Her significant other to interpret. His name is Mathis Bud. This was verified by Raquel, inhouse spanish interpreter. Waiver signed by patient

## 2021-11-13 NOTE — MAU Note (Signed)
OK to d/c EFM per Wynelle Bourgeois CNM

## 2021-11-13 NOTE — MAU Note (Signed)
.  Terri Kane is a 31 y.o. at [redacted]w[redacted]d here in MAU reporting contractions since yesterday and watery d/c since yesterday. Good FM.  Onset of complaint: Wedsnesday Pain score: 10 Vitals:   11/13/21 1922  BP: 98/71  Pulse: 73  Resp: 17  Temp: 98 F (36.7 C)  SpO2: 100%     FHT:143 Lab orders placed from triage:  u/a

## 2021-11-14 DIAGNOSIS — R8271 Bacteriuria: Secondary | ICD-10-CM | POA: Diagnosis not present

## 2021-11-14 DIAGNOSIS — O2343 Unspecified infection of urinary tract in pregnancy, third trimester: Secondary | ICD-10-CM

## 2021-11-14 DIAGNOSIS — K219 Gastro-esophageal reflux disease without esophagitis: Secondary | ICD-10-CM | POA: Diagnosis not present

## 2021-11-14 DIAGNOSIS — O4700 False labor before 37 completed weeks of gestation, unspecified trimester: Secondary | ICD-10-CM

## 2021-11-14 DIAGNOSIS — N898 Other specified noninflammatory disorders of vagina: Secondary | ICD-10-CM

## 2021-11-14 DIAGNOSIS — O219 Vomiting of pregnancy, unspecified: Secondary | ICD-10-CM

## 2021-11-14 DIAGNOSIS — O26893 Other specified pregnancy related conditions, third trimester: Secondary | ICD-10-CM

## 2021-11-14 DIAGNOSIS — Z3A36 36 weeks gestation of pregnancy: Secondary | ICD-10-CM | POA: Diagnosis not present

## 2021-11-14 LAB — CULTURE, OB URINE: Culture: 2000 — AB

## 2021-11-14 MED ORDER — CEFADROXIL 500 MG PO CAPS: 500.0000 mg | ORAL_CAPSULE | Freq: Two times a day (BID) | ORAL | 0 refills | Status: AC

## 2021-11-14 MED ORDER — SCOPOLAMINE 1 MG/3DAYS TD PT72
1.0000 | MEDICATED_PATCH | Freq: Once | TRANSDERMAL | Status: DC
  Administered 2021-11-14: 1.5 mg via TRANSDERMAL
  Filled 2021-11-14: qty 1

## 2021-11-14 MED ORDER — ONDANSETRON 4 MG PO TBDP: 4.0000 mg | ORAL_TABLET | Freq: Four times a day (QID) | ORAL | 0 refills | Status: DC | PRN

## 2021-11-14 NOTE — Progress Notes (Signed)
PT d/c by Anastasio Champion RN

## 2021-11-17 LAB — GC/CHLAMYDIA PROBE AMP (~~LOC~~) NOT AT ARMC
Chlamydia: NEGATIVE
Comment: NEGATIVE
Comment: NORMAL
Neisseria Gonorrhea: NEGATIVE

## 2021-11-19 NOTE — Progress Notes (Unsigned)
       PRENATAL VISIT NOTE- [facility]regnancy Cycle 3, Session # 12  Subjective:  Terri Kane is a 31 y.o. [id number] at [redacted]w[redacted]d being seen today for ongoing prenatal care through [facility].  She is currently monitored for the following issues for this low-risk pregnancy and has GBS bacteriuria and Supervision of other normal pregnancy, antepartum on their problem list.  Patient reports {sx:14538}.   .  .   . ***Denies leaking of fluid/ROM.   The following portions of the patient's history were reviewed and updated as appropriate: allergies, current medications, past family history, past medical history, past social history, past surgical history and problem list. Problem list updated.  Objective:  There were no vitals filed for this visit.  Fetal Status:           General:  Alert, oriented and cooperative. Patient is in no acute distress.  Skin: Skin is warm and dry. No rash noted.   Cardiovascular: Normal heart rate noted  Respiratory: Normal respiratory effort, no problems with respiration noted  Abdomen: Soft, gravid, appropriate for gestational age.        Pelvic: {Blank single:19197::"Cervical exam performed","Cervical exam deferred"}        Extremities: Normal range of motion.     Mental Status: Normal mood and affect. Normal behavior. Normal judgment and thought content.   Assessment and Plan:  Pregnancy: [id number] at [redacted]w[redacted]d  There are no diagnoses linked to this encounter.    [facility]ering Pregnancy, Session#3: Reviewed resources in [employer].   Facilitated discussion today:  Stress/Stress reduction and breastfeeding/infant nutrition Mindfulness activity completed as well as deep breathing with still touch for childbirth preparation.    Fundal height and FHR appropriate today unless noted otherwise in plan. Patient to continue group care.    *** add [facility] dot phrase for session   {Blank single:19197::"Term","Preterm"} labor symptoms and  general obstetric precautions including but not limited to vaginal bleeding, contractions, leaking of fluid and fetal movement were reviewed in detail with the patient. Please refer to After Visit Summary for other counseling recommendations.  No follow-ups on file.  Future Appointments  Date Time Provider [place]  11/20/2021  9:00 AM CENTERING PROVIDER [facility]  11/27/2021  9:15 AM Chancy Milroy, MD [facility]  12/04/2021  9:00 AM CENTERING PROVIDER [facility]  12/11/2021  8:55 AM Deloris Ping, CNM [facility]    Starr Lake, CNM

## 2021-11-20 ENCOUNTER — Other Ambulatory Visit: Payer: Self-pay | Admitting: Student

## 2021-11-20 ENCOUNTER — Ambulatory Visit (INDEPENDENT_AMBULATORY_CARE_PROVIDER_SITE_OTHER): Payer: Self-pay | Admitting: Student

## 2021-11-20 VITALS — BP 109/74 | HR 106 | Wt 118.2 lb

## 2021-11-20 DIAGNOSIS — Z3480 Encounter for supervision of other normal pregnancy, unspecified trimester: Secondary | ICD-10-CM

## 2021-11-20 DIAGNOSIS — Z3A36 36 weeks gestation of pregnancy: Secondary | ICD-10-CM

## 2021-11-20 MED ORDER — SCOPOLAMINE 1 MG/3DAYS TD PT72: 1.0000 | MEDICATED_PATCH | TRANSDERMAL | 12 refills | Status: DC

## 2021-11-20 MED ORDER — CEPHALEXIN 500 MG PO CAPS: 500.0000 mg | ORAL_CAPSULE | Freq: Two times a day (BID) | ORAL | 0 refills | Status: DC

## 2021-11-20 NOTE — Progress Notes (Signed)
Pt had MAU visit on 9/14.

## 2021-11-22 ENCOUNTER — Inpatient Hospital Stay (HOSPITAL_COMMUNITY): Payer: Medicaid Other | Admitting: Anesthesiology

## 2021-11-22 ENCOUNTER — Encounter (HOSPITAL_COMMUNITY): Payer: Self-pay | Admitting: Obstetrics & Gynecology

## 2021-11-22 ENCOUNTER — Inpatient Hospital Stay (HOSPITAL_COMMUNITY)
Admission: AD | Admit: 2021-11-22 | Discharge: 2021-11-24 | DRG: 807 | Disposition: A | Discharge status: Home / Self Care | Payer: Medicaid Other | Attending: Obstetrics & Gynecology | Admitting: Obstetrics & Gynecology

## 2021-11-22 ENCOUNTER — Other Ambulatory Visit: Payer: Self-pay

## 2021-11-22 DIAGNOSIS — Z3A37 37 weeks gestation of pregnancy: Secondary | ICD-10-CM

## 2021-11-22 DIAGNOSIS — Z23 Encounter for immunization: Secondary | ICD-10-CM | POA: Diagnosis not present

## 2021-11-22 DIAGNOSIS — O99824 Streptococcus B carrier state complicating childbirth: Secondary | ICD-10-CM | POA: Diagnosis present

## 2021-11-22 DIAGNOSIS — Z30017 Encounter for initial prescription of implantable subdermal contraceptive: Secondary | ICD-10-CM | POA: Diagnosis not present

## 2021-11-22 DIAGNOSIS — Z975 Presence of (intrauterine) contraceptive device: Secondary | ICD-10-CM

## 2021-11-22 DIAGNOSIS — O26893 Other specified pregnancy related conditions, third trimester: Secondary | ICD-10-CM | POA: Diagnosis present

## 2021-11-22 LAB — CBC
HCT: 32.2 % — ABNORMAL LOW (ref 36.0–46.0)
Hemoglobin: 10.7 g/dL — ABNORMAL LOW (ref 12.0–15.0)
MCH: 28.1 pg (ref 26.0–34.0)
MCHC: 33.2 g/dL (ref 30.0–36.0)
MCV: 84.5 fL (ref 80.0–100.0)
Platelets: 266 10*3/uL (ref 150–400)
RBC: 3.81 MIL/uL — ABNORMAL LOW (ref 3.87–5.11)
RDW: 13.2 % (ref 11.5–15.5)
WBC: 12.5 10*3/uL — ABNORMAL HIGH (ref 4.0–10.5)
nRBC: 0 % (ref 0.0–0.2)

## 2021-11-22 LAB — TYPE AND SCREEN
ABO/RH(D): O POS
Antibody Screen: NEGATIVE

## 2021-11-22 LAB — POCT FERN TEST: POCT Fern Test: POSITIVE

## 2021-11-22 LAB — RPR: RPR Ser Ql: NONREACTIVE

## 2021-11-22 MED ORDER — OXYTOCIN BOLUS FROM INFUSION
333.0000 mL | Freq: Once | INTRAVENOUS | Status: AC
  Administered 2021-11-22: 333 mL via INTRAVENOUS

## 2021-11-22 MED ORDER — SODIUM CHLORIDE 0.9 % IV SOLN: 5.0000 10*6.[IU] | Freq: Once | INTRAVENOUS | Status: DC

## 2021-11-22 MED ORDER — SOD CITRATE-CITRIC ACID 500-334 MG/5ML PO SOLN
30.0000 mL | ORAL | Status: DC | PRN
  Administered 2021-11-22: 30 mL via ORAL
  Filled 2021-11-22: qty 30

## 2021-11-22 MED ORDER — DIPHENHYDRAMINE HCL 50 MG/ML IJ SOLN: 12.5000 mg | INTRAMUSCULAR | Status: DC | PRN

## 2021-11-22 MED ORDER — ACETAMINOPHEN 325 MG PO TABS
650.0000 mg | ORAL_TABLET | ORAL | Status: DC | PRN
  Administered 2021-11-22: 650 mg via ORAL
  Filled 2021-11-22: qty 2

## 2021-11-22 MED ORDER — OXYCODONE-ACETAMINOPHEN 5-325 MG PO TABS: 2.0000 | ORAL_TABLET | ORAL | Status: DC | PRN

## 2021-11-22 MED ORDER — PHENYLEPHRINE 80 MCG/ML (10ML) SYRINGE FOR IV PUSH (FOR BLOOD PRESSURE SUPPORT)
80.0000 ug | PREFILLED_SYRINGE | INTRAVENOUS | Status: AC | PRN
  Administered 2021-11-22 (×3): 80 ug via INTRAVENOUS
  Filled 2021-11-22: qty 10

## 2021-11-22 MED ORDER — ONDANSETRON HCL 4 MG/2ML IJ SOLN: 4.0000 mg | INTRAMUSCULAR | Status: DC | PRN

## 2021-11-22 MED ORDER — EPHEDRINE 5 MG/ML INJ
10.0000 mg | INTRAVENOUS | Status: AC | PRN
  Administered 2021-11-22 (×2): 10 mg via INTRAVENOUS

## 2021-11-22 MED ORDER — TETANUS-DIPHTH-ACELL PERTUSSIS 5-2.5-18.5 LF-MCG/0.5 IM SUSY
0.5000 mL | PREFILLED_SYRINGE | Freq: Once | INTRAMUSCULAR | Status: AC
  Administered 2021-11-23: 0.5 mL via INTRAMUSCULAR
  Filled 2021-11-22: qty 0.5

## 2021-11-22 MED ORDER — SIMETHICONE 80 MG PO CHEW: 80.0000 mg | CHEWABLE_TABLET | ORAL | Status: DC | PRN

## 2021-11-22 MED ORDER — OXYCODONE-ACETAMINOPHEN 5-325 MG PO TABS: 1.0000 | ORAL_TABLET | ORAL | Status: DC | PRN

## 2021-11-22 MED ORDER — LACTATED RINGERS IV SOLN
500.0000 mL | Freq: Once | INTRAVENOUS | Status: AC
  Administered 2021-11-22: 500 mL via INTRAVENOUS

## 2021-11-22 MED ORDER — WITCH HAZEL-GLYCERIN EX PADS: 1.0000 | MEDICATED_PAD | CUTANEOUS | Status: DC | PRN

## 2021-11-22 MED ORDER — ONDANSETRON HCL 4 MG PO TABS: 4.0000 mg | ORAL_TABLET | ORAL | Status: DC | PRN

## 2021-11-22 MED ORDER — COCONUT OIL OIL
1.0000 | TOPICAL_OIL | Status: DC | PRN
  Administered 2021-11-24: 1 via TOPICAL

## 2021-11-22 MED ORDER — PRENATAL MULTIVITAMIN CH
1.0000 | ORAL_TABLET | Freq: Every day | ORAL | Status: DC
  Administered 2021-11-23 – 2021-11-24 (×2): 1 via ORAL
  Filled 2021-11-22 (×2): qty 1

## 2021-11-22 MED ORDER — LIDOCAINE HCL (PF) 1 % IJ SOLN
INTRAMUSCULAR | Status: DC | PRN
  Administered 2021-11-22: 3 mL via EPIDURAL
  Administered 2021-11-22: 2 mL via EPIDURAL
  Administered 2021-11-22: 5 mL via EPIDURAL

## 2021-11-22 MED ORDER — FENTANYL-BUPIVACAINE-NACL 0.5-0.125-0.9 MG/250ML-% EP SOLN
12.0000 mL/h | EPIDURAL | Status: DC | PRN
  Administered 2021-11-22: 12 mL/h via EPIDURAL

## 2021-11-22 MED ORDER — EPHEDRINE 5 MG/ML INJ
10.0000 mg | INTRAVENOUS | Status: DC | PRN
  Filled 2021-11-22: qty 5

## 2021-11-22 MED ORDER — IBUPROFEN 600 MG PO TABS
600.0000 mg | ORAL_TABLET | Freq: Four times a day (QID) | ORAL | Status: DC
  Administered 2021-11-22 – 2021-11-24 (×7): 600 mg via ORAL
  Filled 2021-11-22 (×7): qty 1

## 2021-11-22 MED ORDER — ONDANSETRON HCL 4 MG/2ML IJ SOLN: 4.0000 mg | Freq: Four times a day (QID) | INTRAMUSCULAR | Status: DC | PRN

## 2021-11-22 MED ORDER — TRANEXAMIC ACID-NACL 1000-0.7 MG/100ML-% IV SOLN
1000.0000 mg | Freq: Once | INTRAVENOUS | Status: AC
  Administered 2021-11-22: 1000 mg via INTRAVENOUS

## 2021-11-22 MED ORDER — LACTATED RINGERS IV SOLN: 500.0000 mL | Freq: Once | INTRAVENOUS | Status: DC

## 2021-11-22 MED ORDER — LIDOCAINE HCL (PF) 1 % IJ SOLN: 30.0000 mL | INTRAMUSCULAR | Status: DC | PRN

## 2021-11-22 MED ORDER — LACTATED RINGERS IV SOLN: INTRAVENOUS | Status: DC

## 2021-11-22 MED ORDER — SODIUM CHLORIDE 0.9 % IV SOLN
2.0000 g | Freq: Four times a day (QID) | INTRAVENOUS | Status: DC
  Administered 2021-11-22: 2 g via INTRAVENOUS
  Filled 2021-11-22: qty 2000

## 2021-11-22 MED ORDER — PENICILLIN G POT IN DEXTROSE 60000 UNIT/ML IV SOLN: 3.0000 10*6.[IU] | INTRAVENOUS | Status: DC

## 2021-11-22 MED ORDER — ETONOGESTREL 68 MG ~~LOC~~ IMPL
68.0000 mg | DRUG_IMPLANT | Freq: Once | SUBCUTANEOUS | Status: AC
  Administered 2021-11-24: 68 mg via SUBCUTANEOUS
  Filled 2021-11-22: qty 1

## 2021-11-22 MED ORDER — ZOLPIDEM TARTRATE 5 MG PO TABS: 5.0000 mg | ORAL_TABLET | Freq: Every evening | ORAL | Status: DC | PRN

## 2021-11-22 MED ORDER — SENNOSIDES-DOCUSATE SODIUM 8.6-50 MG PO TABS
2.0000 | ORAL_TABLET | ORAL | Status: DC
  Administered 2021-11-23 – 2021-11-24 (×2): 2 via ORAL
  Filled 2021-11-22 (×2): qty 2

## 2021-11-22 MED ORDER — TRANEXAMIC ACID-NACL 1000-0.7 MG/100ML-% IV SOLN
INTRAVENOUS | Status: AC
  Filled 2021-11-22: qty 100

## 2021-11-22 MED ORDER — DIBUCAINE (PERIANAL) 1 % EX OINT: 1.0000 | TOPICAL_OINTMENT | CUTANEOUS | Status: DC | PRN

## 2021-11-22 MED ORDER — FLEET ENEMA 7-19 GM/118ML RE ENEM: 1.0000 | ENEMA | RECTAL | Status: DC | PRN

## 2021-11-22 MED ORDER — ACETAMINOPHEN 325 MG PO TABS
650.0000 mg | ORAL_TABLET | ORAL | Status: DC | PRN
  Administered 2021-11-23 – 2021-11-24 (×2): 650 mg via ORAL
  Filled 2021-11-22 (×2): qty 2

## 2021-11-22 MED ORDER — OXYTOCIN-SODIUM CHLORIDE 30-0.9 UT/500ML-% IV SOLN
2.5000 [IU]/h | INTRAVENOUS | Status: DC
  Filled 2021-11-22: qty 500

## 2021-11-22 MED ORDER — PHENYLEPHRINE 80 MCG/ML (10ML) SYRINGE FOR IV PUSH (FOR BLOOD PRESSURE SUPPORT): 80.0000 ug | PREFILLED_SYRINGE | INTRAVENOUS | Status: DC | PRN

## 2021-11-22 MED ORDER — LACTATED RINGERS IV SOLN
500.0000 mL | INTRAVENOUS | Status: DC | PRN
  Administered 2021-11-22: 500 mL via INTRAVENOUS

## 2021-11-22 MED ORDER — DIPHENHYDRAMINE HCL 25 MG PO CAPS: 25.0000 mg | ORAL_CAPSULE | Freq: Four times a day (QID) | ORAL | Status: DC | PRN

## 2021-11-22 MED ORDER — LIDOCAINE HCL 1 % IJ SOLN
0.0000 mL | Freq: Once | INTRAMUSCULAR | Status: AC | PRN
  Administered 2021-11-24: 20 mL via INTRADERMAL
  Filled 2021-11-22: qty 20

## 2021-11-22 MED ORDER — FENTANYL-BUPIVACAINE-NACL 0.5-0.125-0.9 MG/250ML-% EP SOLN
EPIDURAL | Status: AC
  Filled 2021-11-22: qty 250

## 2021-11-22 MED ORDER — BENZOCAINE-MENTHOL 20-0.5 % EX AERO
1.0000 | INHALATION_SPRAY | CUTANEOUS | Status: DC | PRN
  Administered 2021-11-22: 1 via TOPICAL
  Filled 2021-11-22: qty 56

## 2021-11-22 NOTE — Anesthesia Procedure Notes (Signed)
Epidural Patient location during procedure: OB Start time: 11/22/2021 12:18 PM End time: 11/22/2021 12:24 PM  Staffing Anesthesiologist: Suzette Battiest, MD Performed: anesthesiologist   Preanesthetic Checklist Completed: patient identified, IV checked, site marked, risks and benefits discussed, surgical consent, monitors and equipment checked, pre-op evaluation and timeout performed  Epidural Patient position: sitting Prep: DuraPrep and site prepped and draped Patient monitoring: continuous pulse ox and blood pressure Approach: midline Location: L4-L5 Injection technique: LOR air  Needle:  Needle type: Tuohy  Needle gauge: 17 G Needle length: 9 cm and 9 Needle insertion depth: 4 cm Catheter type: closed end flexible Catheter size: 19 Gauge Catheter at skin depth: 9 cm Test dose: negative  Assessment Events: blood not aspirated, injection not painful, no injection resistance, no paresthesia and negative IV test

## 2021-11-22 NOTE — Anesthesia Preprocedure Evaluation (Signed)
Anesthesia Evaluation  Patient identified by MRN, date of birth, ID band Patient awake    Reviewed: Allergy & Precautions, Patient's Chart, lab work & pertinent test results  Airway Mallampati: II  TM Distance: >3 FB     Dental   Pulmonary neg pulmonary ROS,    Pulmonary exam normal        Cardiovascular negative cardio ROS Normal cardiovascular exam     Neuro/Psych  Headaches,    GI/Hepatic negative GI ROS, Neg liver ROS,   Endo/Other  negative endocrine ROS  Renal/GU negative Renal ROS     Musculoskeletal   Abdominal   Peds  Hematology  (+) Blood dyscrasia, anemia ,   Anesthesia Other Findings   Reproductive/Obstetrics (+) Pregnancy                             Anesthesia Physical Anesthesia Plan  ASA: 2  Anesthesia Plan: Epidural   Post-op Pain Management:    Induction:   PONV Risk Score and Plan: Treatment may vary due to age or medical condition  Airway Management Planned: Natural Airway  Additional Equipment:   Intra-op Plan:   Post-operative Plan:   Informed Consent: I have reviewed the patients History and Physical, chart, labs and discussed the procedure including the risks, benefits and alternatives for the proposed anesthesia with the patient or authorized representative who has indicated his/her understanding and acceptance.       Plan Discussed with:   Anesthesia Plan Comments:         Anesthesia Quick Evaluation

## 2021-11-22 NOTE — H&P (Signed)
OBSTETRIC ADMISSION HISTORY AND PHYSICAL  Terri Kane is a 31 y.o. female [phone] with IUP at [redacted]w[redacted]d by early ultrasound presenting for spontaneous onset of labor. She reports +FMs, No LOF, no VB, no blurry vision, headaches or peripheral edema, and RUQ pain.  She plans on breast and bottle feeding. She request Nexplanon for birth control. She received her prenatal care at [facility]   Dating: By early ultrasound --->  Estimated Date of Delivery: 12/12/21  Sono:    @[redacted]w[redacted]d , CWD, normal anatomy, cephalic presentation, 2511g, EFW   Nursing Staff Provider  Office Location  [facility] Dating    Early 45%   [facility] Model [ ]  Traditional [ x] Centering [ ]  Mom-Baby Dyad    Language  Spanish Anatomy [facility]   normal with fup for history of pre-e  Flu Vaccine   Genetic/Carrier Screen  NIPS:   Low risk female AFP:   Screen negative Horizon: neg 4/4  TDaP Vaccine   10/09/2021 Hgb A1C or  GTT Early 5.3 Third trimester  Glucose, Fasting 70 - 91 mg/dL 79   Glucose, 1 hour 70 - 179 mg/dL [place]   Glucose, 2 hour 70 - 152 mg/dL 97      COVID Vaccine Pfizer-1 dose   LAB RESULTS   Rhogam   Blood Type O/Positive/-- (03/23 1527)   Baby Feeding Plan Breast & Bottle Antibody Negative (03/23 1527)  Contraception nexplanon Rubella 2.50 (03/23 1527)  Circumcision Yes RPR Non Reactive (03/23 1527)   Pediatrician  List given HBsAg Negative (03/23 1527)   Support Person Keiby(Friend) HCVAb   Prenatal Classes  HIV Non Reactive (03/23 1527)     BTL Consent NA GBS   (For PCN allergy, check sensitivities)   VBAC Consent NA Pap  normal but absent trans zone       DME Rx [ X] BP cuff [ ]  Weight Scale Waterbirth  [ ]  Class [ ]  Consent [ ]  CNM visit  PHQ9 & GAD7 [  ] new OB [  ] 28 weeks  [  ] 36 weeks Induction  [ ]  Orders Entered [ ] Foley Y/N   Prenatal History/Complications: GBS bacteruria  Past Medical History: Past Medical History:  Diagnosis Date   Depression    feeling better   Headache     Hypotension    Ovarian cyst    Pregnancy induced hypertension    UTI (urinary tract infection)     Past Surgical History: Past Surgical History:  Procedure Laterality Date   NO PAST SURGERIES      Obstetrical History: OB History     Gravida  4   Para  2   Term  2   Preterm      AB  1   Living  2      SAB  1   IAB      Ectopic      Multiple      Live Births  2        Obstetric Comments  2013 HTN         Social History Social History   Socioeconomic History   Marital status: Single    Spouse name: Not on file   Number of children: Not on file   Years of education: Not on file   Highest education level: Not on file  Occupational History   Not on file  Tobacco Use   Smoking status: Never   Smokeless tobacco: Never  Vaping Use  Vaping Use: Never used  Substance and Sexual Activity   Alcohol use: Not Currently   Drug use: Never   Sexual activity: Not Currently    Birth control/protection: None  Other Topics Concern   Not on file  Social History Narrative   Not on file   Social Determinants of Health   Financial Resource Strain: Not on file  Food Insecurity: Food Insecurity Present (11/20/2021)   Hunger Vital Sign    Worried About Running Out of Food in the Last Year: Sometimes true    Ran Out of Food in the Last Year: Never true  Transportation Needs: Unmet Transportation Needs (11/20/2021)   PRAPARE - Hydrologist (Medical): Yes    Lack of Transportation (Non-Medical): Yes  Physical Activity: Not on file  Stress: Not on file  Social Connections: Not on file    Family History: Family History  Problem Relation Age of Onset   Healthy Mother    Migraines Mother    Healthy Father     Allergies: No Known Allergies  Medications Prior to Admission  Medication Sig Dispense Refill Last Dose   acetaminophen (TYLENOL) 500 MG tablet Take 1,000 mg by mouth every 6 (six) hours as needed.      aspirin EC 81  MG tablet Take 1 tablet (81 mg total) by mouth daily. Swallow whole. 90 tablet 2    cephALEXin (KEFLEX) 500 MG capsule Take 1 capsule (500 mg total) by mouth 2 (two) times daily. 14 capsule 0    famotidine (PEPCID) 20 MG tablet Take 1 tablet (20 mg total) by mouth 2 (two) times daily. 60 tablet 0    ondansetron (ZOFRAN-ODT) 4 MG disintegrating tablet Take 1 tablet (4 mg total) by mouth every 6 (six) hours as needed for nausea. 20 tablet 0    Prenatal Vit-Fe Fumarate-FA (MULTIVITAMIN-PRENATAL) 27-0.8 MG TABS tablet Take 1 tablet by mouth daily at 12 noon.      scopolamine (TRANSDERM-SCOP) 1 MG/3DAYS Place 1 patch (1.5 mg total) onto the skin every 3 (three) days. 10 patch 12    Review of Systems   All systems reviewed and negative except as stated in HPI  Blood pressure 106/74, pulse 95, temperature 98.3 F (36.8 C), resp. rate 18, last menstrual period 03/07/2021. General appearance: alert, cooperative, and mild distress Lungs: clear to auscultation bilaterally Heart: regular rate and rhythm Abdomen: soft, non-tender; bowel sounds normal Pelvic: n/a Extremities: Homans sign is negative, no sign of DVT DTR's +2 Presentation: cephalic Fetal monitoringBaseline: 150 bpm, Variability: Good {> 6 bpm), Accelerations: Reactive, and Decelerations: Absent Uterine activityFrequency: Every 3-5 minutes Cervix: 4.5/80/-1   Prenatal labs: ABO, Rh: O/Positive/-- (03/23 1527) Antibody: Negative (03/23 1527) Rubella: 2.50 (03/23 1527) RPR: Non Reactive (07/17 0959)  HBsAg: Negative (03/23 1527)  HIV: Non Reactive (07/17 0959)  GBS: Positive/-- (04/12 0000)   Prenatal Transfer Tool  Maternal Diabetes: No Genetic Screening: Normal Maternal Ultrasounds/Referrals: Normal Fetal Ultrasounds or other Referrals:  None Maternal Substance Abuse:  No Significant Maternal Medications:  None Significant Maternal Lab Results:  Group B Strep positive Number of Prenatal Visits:greater than 3 verified  prenatal visits  No results found for this or any previous visit (from the past 24 hour(s)).  Patient Active Problem List   Diagnosis Date Noted   Supervision of other normal pregnancy, antepartum 05/22/2021   GBS bacteriuria 05/05/2021    Assessment/Plan:  Corvette Ivon Carianne Taira is a 31 y.o. [id number] at [redacted]w[redacted]d here for spontaneous  onset of labor  #Labor: expectant management for now #Pain: epidural #FWB: Cat 1 #ID:  GBS pos #MOF: Both #MOC: Nexplanon #Circ:  N/a  Rolm Bookbinder, CNM  11/22/2021, 10:26 AM

## 2021-11-22 NOTE — MAU Note (Signed)
.  Terri Kane is a 31 y.o. at [redacted]w[redacted]d here in MAU reporting: ctx since 11pm. C/o lots of pressure and pain. Denies any vag bleeding or leaking. 2cm last week in office. Also c/o headache LMP:  Onset of complaint: 11pm Pain score: 10 Vitals:   11/22/21 0912  BP: 106/74  Pulse: 95  Resp: 18  Temp: 98.3 F (36.8 C)     FHT:150 Lab orders placed from triage:  labor eval

## 2021-11-22 NOTE — Discharge Summary (Signed)
Postpartum Discharge Summary  Date of Service updated***     Patient Name: Terri Kane DOB: 10-31-1990 MRN: [id number]  Date of admission: 11/22/2021 Delivery date:11/22/2021  Delivering provider: Gavin Pound  Date of discharge: 11/22/2021  Admitting diagnosis: Normal labor [O80, Z37.9] Intrauterine pregnancy: [redacted]w[redacted]d    Secondary diagnosis:  Principal Problem:   Normal labor Active Problems:   Vaginal delivery  Additional problems: ***    Discharge diagnosis: Term Pregnancy Delivered                                              Post partum procedures:{Postpartum procedures:23558} Augmentation: AROM Complications: None  Hospital course: Onset of Labor With Vaginal Delivery      31y.o. yo [id number] 319w1das admitted in Active Labor on 11/22/2021. Patient had an uncomplicated labor course as follows:  Membrane Rupture Time/Date: 11:19 AM ,11/22/2021   Delivery Method:Vaginal, Spontaneous  Episiotomy: None  Lacerations:  Perineal;None  Patient had an uncomplicated postpartum course.  She is ambulating, tolerating a regular diet, passing flatus, and urinating well. Patient is discharged home in stable condition on 11/22/21.  Newborn Data: Birth date:11/22/2021  Birth time:5:02 PM  Gender:Female  Living status:Living  Apgars:9 ,10  Weight:2970 g   Magnesium Sulfate received: {Mag received:30440022} BMZ received: {BMZ received:30440023} Rhophylac:{Rhophylac received:30440032} MMVZC:{HYI:50277412}-DaP:{Tdap:23962} Flu: {F{INO:67672}ransfusion:{Transfusion received:30440034}  Physical exam  Vitals:   11/22/21 1815 11/22/21 1830 11/22/21 1845 11/22/21 1854  BP: (!) 108/58 105/67 (!) 106/59   Pulse: (!) 112 (!) 110 (!) 110   Resp: 18 16  18   Temp:      TempSrc:      SpO2:      Weight:      Height:       General: {Exam; general:21111117} Lochia: {Desc; appropriate/inappropriate:30686::"appropriate"} Uterine Fundus: {Desc;  firm/soft:30687} Incision: {Exam; incision:21111123} DVT Evaluation: {Exam; dvt:2111122} Labs: Lab Results  Component Value Date   WBC 12.5 (H) 11/22/2021   HGB 10.7 (L) 11/22/2021   HCT 32.2 (L) 11/22/2021   MCV 84.5 11/22/2021   PLT 266 11/22/2021      Latest Ref Rng & Units 11/13/2021   10:30 PM  CMP  Glucose 70 - 99 mg/dL 91   BUN 6 - 20 mg/dL <5   Creatinine 0.44 - 1.00 mg/dL 0.68   Sodium 135 - 145 mmol/L 139   Potassium 3.5 - 5.1 mmol/L 3.8   Chloride 98 - 111 mmol/L 107   CO2 22 - 32 mmol/L 23   Calcium 8.9 - 10.3 mg/dL 9.3   Total Protein 6.5 - 8.1 g/dL 5.9   Total Bilirubin 0.3 - 1.2 mg/dL 0.4   Alkaline Phos 38 - 126 U/L 160   AST 15 - 41 U/L 18   ALT 0 - 44 U/L 22    Edinburgh Score:     No data to display           After visit meds:  Allergies as of 11/22/2021   No Known Allergies   Med [facility] completed [facility]        Discharge home in stable condition Infant Feeding: {Baby feeding:23562} Infant Disposition:{CHL IP OB HOME WITH MOCNOBSJ:62836}ischarge instruction: per After Visit Summary and Postpartum booklet. Activity: Advance as tolerated. Pelvic rest for 6 weeks.  Diet: {OB diOQHU:76546503}uture Appointments: Future  Appointments  Date Time Provider [place]  11/27/2021  9:15 AM Chancy Milroy, MD [facility]  12/04/2021  9:00 AM CENTERING PROVIDER [facility]  12/11/2021  8:55 AM Deloris Ping, CNM [facility]   Follow up Visit: Message Sent 9/23   Please schedule this patient for a In person postpartum visit in  4-6 weeks  with the following provider: Any provider. Additional Postpartum F/U: None   Low risk pregnancy complicated by:  None Delivery mode:  Vaginal, Spontaneous  Anticipated Birth Control:  PP Nexplanon to be placed   11/22/2021 Maryann Conners, CNM

## 2021-11-22 NOTE — Lactation Note (Signed)
This note was copied from a baby's chart. Lactation Consultation Note  Patient Name: Terri Kane OINOM'V Date: 11/22/2021   Age:29 hours Declines Lactation services. Maternal Data    Feeding    LATCH Score                    Lactation Tools Discussed/Used    Interventions    Discharge    Consult Status Consult Status: Complete    [name] G 11/22/2021, 11:46 PM

## 2021-11-22 NOTE — Progress Notes (Signed)
Terri Kane [id number]  Subjective: Nurse call requests review.  States patient with hypotension and s/p treatment. Strip and Chart Reviewed.  Objective:  Vitals:   11/22/21 1527 11/22/21 1529 11/22/21 1531 11/22/21 1533  BP: (!) 86/66 (!) 83/45 (!) 101/53 (!) 106/47  Pulse: (!) 102 (!) 127 100 (!) 114  Resp:      Temp:      TempSrc:      SpO2:        FHR: 150 bpm, Mod Var, +Decels, +Accels UC: Irregular  Assessment: IUP at [redacted]w[redacted]d Cat II FT Active Labor  Plan: -FHR reassuring considering maternal blood pressure and medications given. -Continue to monitor and manage blood press -Per nurse, Anesthesia reduced epidural infusion.ures as appropriate.   Milinda Cave, CNM 11/22/2021 3:36 PM

## 2021-11-23 LAB — CBC
HCT: 24.8 % — ABNORMAL LOW (ref 36.0–46.0)
Hemoglobin: 8 g/dL — ABNORMAL LOW (ref 12.0–15.0)
MCH: 28.1 pg (ref 26.0–34.0)
MCHC: 32.3 g/dL (ref 30.0–36.0)
MCV: 87 fL (ref 80.0–100.0)
Platelets: 224 10*3/uL (ref 150–400)
RBC: 2.85 MIL/uL — ABNORMAL LOW (ref 3.87–5.11)
RDW: 13.4 % (ref 11.5–15.5)
WBC: 13.8 10*3/uL — ABNORMAL HIGH (ref 4.0–10.5)
nRBC: 0 % (ref 0.0–0.2)

## 2021-11-23 NOTE — Progress Notes (Addendum)
CSW received consult for hx of Anxiety and Depression.  CSW met with MOB to offer support and complete assessment. CSW was accompanied by in house [place] interpreter, [place]. When CSW entered room, FOB and MOB's other children were present. CSW requested to speak with MOB alone. FOB and children left room. MOB presented as open to consult and cheerful. CSW introduced self and explained reason for consult.   MOB denied a history of mental health diagnoses. MOB reports she has not attended therapy or taken medication for mental health in the past. MOB reports she experienced some feelings of stress in the beginning of pregnancy due to her pregnancy being a surprise but shared that once she became prepared for infant's arrival, she was happy the rest of her pregnancy. MOB shared she was excited to have a girl, as her two older children are boys. MOB reports she feels supported by FOB. MOB denied current SI/HI/DV.  MOB reports she has all needed items for infant, including a car seat and crib. MOB has chosen [facility] as infant's pediatrician office.   MOB reports she receives [facility]. CSW encouraged MOB to notify her caseworker of infant's birth to have infant added to benefits. CSW also provided MOB with the [employer] food [employer] schedule. MOB expressed interest in applying for [facility]. CSW provided MOB with the link to apply for food stamps online.   CSW provided education regarding the baby blues period vs. perinatal mood disorders, discussed treatment and gave resources for mental health follow up if concerns arise.  CSW recommends self-evaluation during the postpartum time period using the New Mom Checklist from Postpartum Progress and encouraged MOB to contact a medical professional if symptoms are noted at any time.    CSW provided review of Sudden Infant Death Syndrome (SIDS) precautions.    CSW identifies no further need for  intervention and no barriers to discharge at  this time.

## 2021-11-23 NOTE — Progress Notes (Signed)
Post Partum Day #1 Subjective: no complaints, up ad lib, voiding, and tolerating PO  Objective: Blood pressure (!) 96/58, pulse 87, temperature 98.6 F (37 C), temperature source Oral, resp. rate 18, height 4\' 11"  (1.499 m), weight 53.6 kg, last menstrual period 03/07/2021, SpO2 99 %, unknown if currently breastfeeding.  Physical Exam:  General: alert, cooperative, and no distress Lochia: appropriate Uterine Fundus: firm Incision: NA DVT Evaluation: No evidence of DVT seen on physical exam.  Recent Labs    11/22/21 1035 11/23/21 0446  HGB 10.7* 8.0*  HCT 32.2* 24.8*    Assessment/Plan: Plan for discharge tomorrow   LOS: 1 day   Starr [place], CNM 11/23/2021, 7:14 AM

## 2021-11-23 NOTE — Anesthesia Postprocedure Evaluation (Signed)
Anesthesia Post Note  Patient: Terri Kane  Procedure(s) Performed: AN AD [place]     Patient location during evaluation: Mother Baby Anesthesia Type: Epidural Level of consciousness: awake and alert Pain management: pain level controlled Vital Signs Assessment: post-procedure vital signs reviewed and stable Respiratory status: spontaneous breathing, nonlabored ventilation and respiratory function stable Cardiovascular status: stable Postop Assessment: no headache, no backache, epidural receding, no apparent nausea or vomiting, patient able to bend at knees, adequate PO intake and able to ambulate Anesthetic complications: no   No notable events documented.  Last Vitals:  Vitals:   11/23/21 0049 11/23/21 0525  BP: 96/60 (!) 96/58  Pulse: 79 87  Resp: 18 18  Temp: 36.9 C 37 C  SpO2: 100% 99%    Last Pain:  Vitals:   11/23/21 0527  TempSrc:   PainSc: 0-No pain   Pain Goal:                   [employer]

## 2021-11-24 ENCOUNTER — Encounter (HOSPITAL_COMMUNITY): Payer: Self-pay | Admitting: Obstetrics & Gynecology

## 2021-11-24 ENCOUNTER — Other Ambulatory Visit (HOSPITAL_COMMUNITY): Payer: Self-pay

## 2021-11-24 DIAGNOSIS — Z30017 Encounter for initial prescription of implantable subdermal contraceptive: Secondary | ICD-10-CM

## 2021-11-24 DIAGNOSIS — Z975 Presence of (intrauterine) contraceptive device: Secondary | ICD-10-CM

## 2021-11-24 HISTORY — PX: NEXPLANON TRAY: NUR84248

## 2021-11-24 MED ORDER — ACETAMINOPHEN 325 MG PO TABS
650.0000 mg | ORAL_TABLET | ORAL | 0 refills | Status: DC | PRN
  Filled 2021-11-24: qty 30, 3d supply, fill #0

## 2021-11-24 MED ORDER — BENZOCAINE-MENTHOL 20-0.5 % EX AERO
1.0000 | INHALATION_SPRAY | CUTANEOUS | 0 refills | Status: DC | PRN
  Filled 2021-11-24: qty 78, fill #0

## 2021-11-24 MED ORDER — IBUPROFEN 600 MG PO TABS
600.0000 mg | ORAL_TABLET | Freq: Four times a day (QID) | ORAL | 0 refills | Status: DC
  Filled 2021-11-24: qty 30, 8d supply, fill #0

## 2021-11-24 MED ORDER — SENNOSIDES-DOCUSATE SODIUM 8.6-50 MG PO TABS
2.0000 | ORAL_TABLET | ORAL | 0 refills | Status: DC
  Filled 2021-11-24: qty 30, 15d supply, fill #0

## 2021-11-24 NOTE — Procedures (Signed)
Post-Placental Nexplanon Insertion Procedure Note  Patient was identified. Informed consent was signed, signed copy in chart. A time-out was performed.    The insertion site was identified 8-10 cm (3-4 inches) from the medial epicondyle of the humerus and 3-5 cm (1.25-2 inches) posterior to (below) the sulcus (groove) between the biceps and triceps muscles of the patient's left arm and marked. The site was prepped and draped in the usual sterile fashion. Pt was prepped with alcohol swab and then injected with 3 cc of 1% lidocaine. The site was prepped with betadine. Nexplanon removed form packaging,  Device confirmed in needle, then inserted full length of needle and withdrawn per handbook instructions. Provider and patient verified presence of the implant in the woman's arm by palpation. Pt insertion site was covered with steristrips/adhesive bandage and pressure bandage. There was minimal blood loss. Patient tolerated procedure well.  Patient was given post procedure instructions and Nexplanon user card with expiration date. Condoms were recommended for STI prevention. Patient was asked to keep the pressure dressing on for 24 hours to minimize bruising and keep the adhesive bandage on for 3-5 days. The patient verbalized understanding of the plan of care and agrees.   Lot # [id number] Expiration Date [place], [place] Fellow, Faculty practice [place] [facility] 11/24/21

## 2021-11-24 NOTE — Progress Notes (Signed)
[employer] interpreter [phone] is used for discharge instructions. Patient and FOB state understanding information.

## 2021-11-27 ENCOUNTER — Encounter: Payer: Self-pay | Admitting: Obstetrics and Gynecology

## 2021-12-01 ENCOUNTER — Telehealth (HOSPITAL_COMMUNITY): Payer: Self-pay | Admitting: *Deleted

## 2021-12-01 NOTE — Telephone Encounter (Signed)
Voicemail not setup. Unable to leave message.  Odis Hollingshead, RN 12-01-2021 at 1:41pm

## 2021-12-01 NOTE — Telephone Encounter (Signed)
Interpreter not available. Will try again later.  Odis Hollingshead, RN 12-01-2021 at 11:55am

## 2021-12-11 ENCOUNTER — Encounter: Payer: Self-pay | Admitting: Certified Nurse Midwife

## 2021-12-29 NOTE — Progress Notes (Signed)
Provider location: [facility] [employer] [facility] [facility]   Patient location: Home  I connected with Terri Kane on 12/30/21 at 10:55 AM EDT by Mychart Video Encounter and verified that I am speaking with the correct person using two identifiers.       I discussed the limitations, risks, security and privacy concerns of performing an evaluation and management service virtually and the availability of in person appointments. I also discussed with the patient that there may be a patient responsible charge related to this service. The patient expressed understanding and agreed to proceed.  Post Partum Visit Note Subjective:   Terri Kane is a 31 y.o. [phone] female who presents for a postpartum visit. She is 5 weeks postpartum following a normal spontaneous vaginal delivery.  I have fully reviewed the prenatal and intrapartum course. The delivery was at [redacted]w[redacted]d gestational weeks.  Anesthesia: epidural. Postpartum course has been uneventful. Baby is doing well. Baby is feeding by bottle - Similac Advance. Bleeding no bleeding. Bowel function is normal. Bladder function is normal. Patient is not sexually active. Contraception method is Nexplanon. Postpartum depression screening: negative.   The pregnancy intention screening data noted above was reviewed. Potential methods of contraception were discussed. The patient elected to proceed with No data recorded.   Edinburgh Postnatal Depression Scale - 12/30/21 0946       Edinburgh Postnatal Depression Scale:  In the Past 7 Days   I have been able to laugh and see the funny side of things. 0    I have looked forward with enjoyment to things. 0    I have blamed myself unnecessarily when things went wrong. 0    I have been anxious or worried for no good reason. 0    I have felt scared or panicky for no good reason. 0    Things have been getting on top of me. 0    I have been so unhappy that I have had  difficulty sleeping. 0    I have felt sad or miserable. 0    I have been so unhappy that I have been crying. 0    The thought of harming myself has occurred to me. 0    Edinburgh Postnatal Depression Scale Total 0             The following portions of the patient's history were reviewed and updated as appropriate: allergies, current medications, past family history, past medical history, past social history, past surgical history, and problem list.  Review of Systems Pertinent items noted in HPI and remainder of comprehensive ROS otherwise negative.  Objective:  Breastfeeding No     General:  Alert, oriented and cooperative. Patient is in no acute distress.  Respiratory: Normal respiratory effort, no problems with respiration noted  Mental Status: Normal mood and affect. Normal behavior. Normal judgment and thought content.  Rest of physical exam deferred due to type of encounter   Assessment:    Normal postpartum exam.  Plan:  Essential components of care per ACOG recommendations:  1.  Mood and well being: Patient with negative depression screening today. Reviewed local resources for support.  - Patient does not use tobacco.  - hx of drug use? No    2. Infant care and feeding:  -Patient currently breastmilk feeding? No. -Social determinants of health (SDOH) reviewed in EPIC.   3. Sexuality, contraception and birth spacing - Patient does not want a pregnancy in the next year.  Desired family size is 3 children.  - Reviewed forms of contraception in tiered fashion. Patient had post partum Nexplanon placed in the hospital after delivery.   - Discussed birth spacing of 18 months  4. Sleep and fatigue -Encouraged family/partner/community support of 4 hrs of uninterrupted sleep to help with mood and fatigue  5. Physical Recovery  - Discussed patients delivery and complications - Patient had no laceration, perineal healing reviewed. Patient expressed understanding - Patient  has urinary incontinence? No - Patient is safe to resume physical and sexual activity  6.  Health Maintenance - Last pap smear done 06/03/2021 and was normal with absent TZ, per ASCCP continue with routine screening and repeat pap smear in 3 years. - Mammogram: not indicated  7. Chronic Disease - None  I provided 10 minutes of face-to-face time during this encounter.    Return in about 1 year (around 12/31/2022) for Annual Wellness Visit.  Future Appointments  Date Time Provider Department Center  12/30/2021 10:55 AM Crissie Reese, Mary Sella, MD [facility]    Venora Maples, MD/MPH Attending Family Medicine Physician, [facility] for [facility], [facility]

## 2021-12-30 ENCOUNTER — Telehealth (INDEPENDENT_AMBULATORY_CARE_PROVIDER_SITE_OTHER): Payer: Self-pay | Admitting: Family Medicine

## 2021-12-30 ENCOUNTER — Encounter: Payer: Self-pay | Admitting: Family Medicine

## 2021-12-30 DIAGNOSIS — Z975 Presence of (intrauterine) contraceptive device: Secondary | ICD-10-CM

## 2023-03-06 IMAGING — US US OB < 14 WEEKS - US OB TV
1 series · 15 of 28 positions shown · non-contrast
Comparison: None.

CLINICAL DATA: 31-year-old pregnant female presents with cramping.
Quantitative beta HCG pending. EDC by LMP: 12/12/2021, projecting to
an expected gestational age of 8 weeks 0 days.

EXAM:
OBSTETRIC <14 WK US AND TRANSVAGINAL OB US
TECHNIQUE: Both transabdominal and transvaginal ultrasound examinations were
performed for complete evaluation of the gestation as well as the
maternal uterus, adnexal regions, and pelvic cul-de-sac.
Transvaginal technique was performed to assess early pregnancy.

[Series 1: us ob < 14 weeks - us ob tv · 75 acquisitions, 15 frames shown]
[im 1/75]
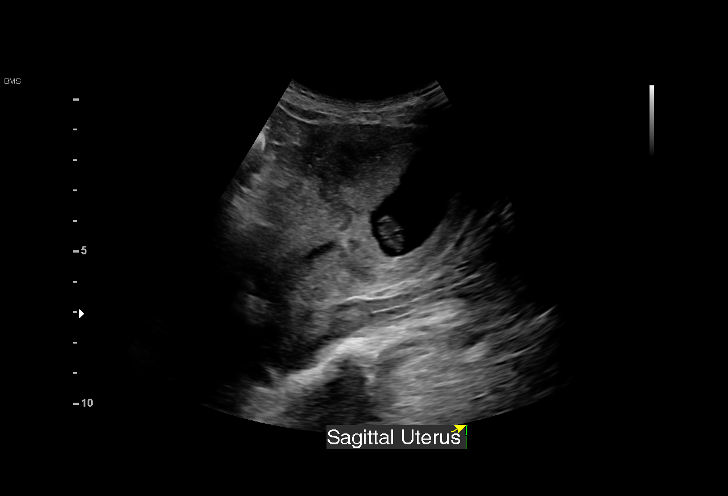
[im 6/75]
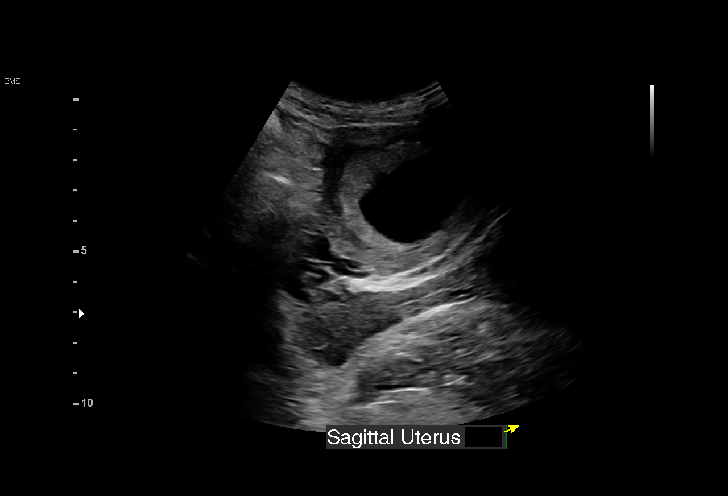
[im 11/75]
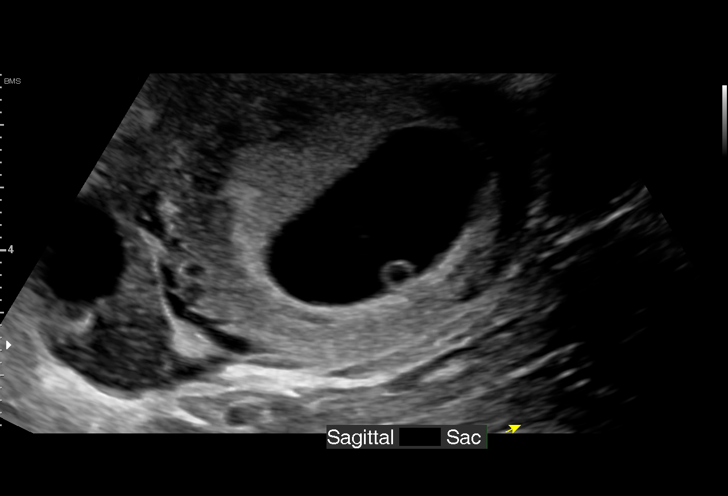
[im 17/75]
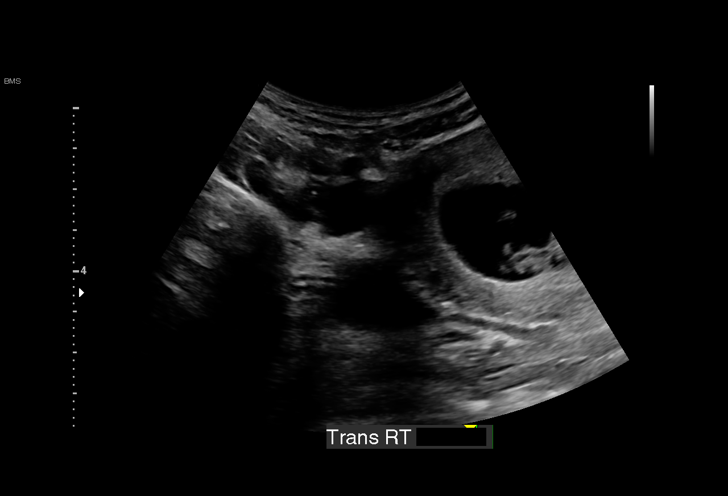
[im 22/75]
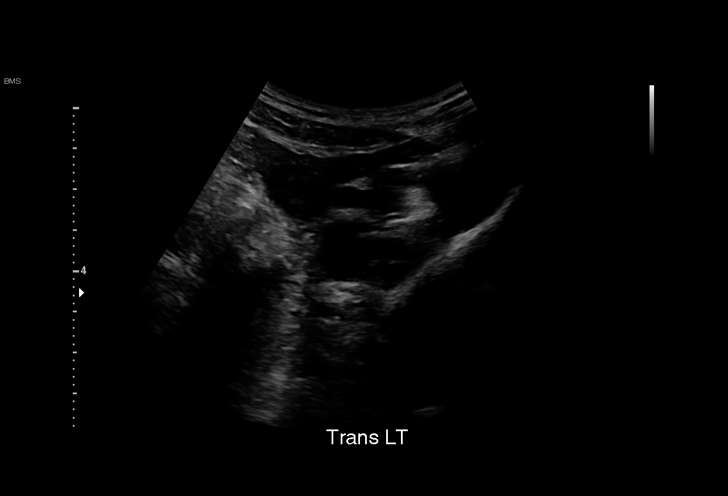
[im 28/75]
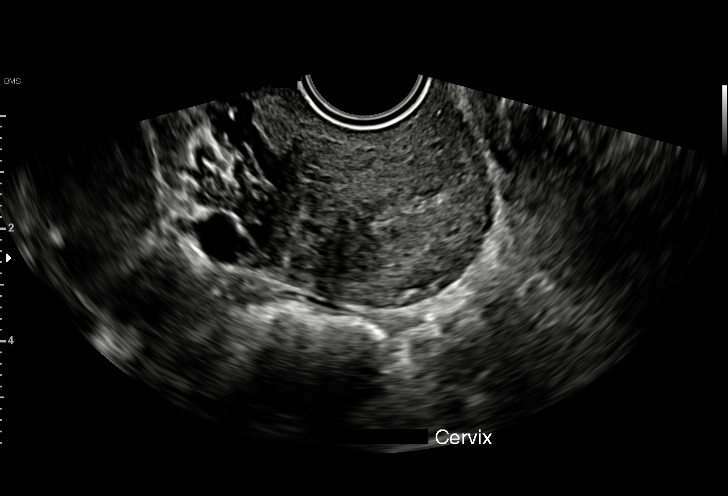
[im 33/75]
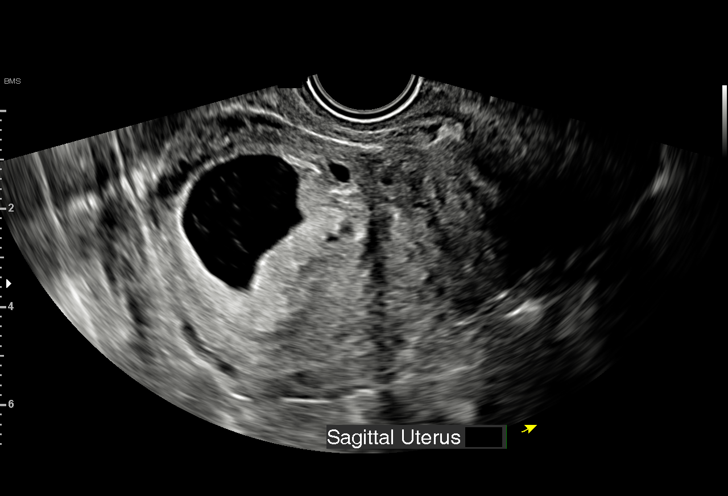
[im 39/75]
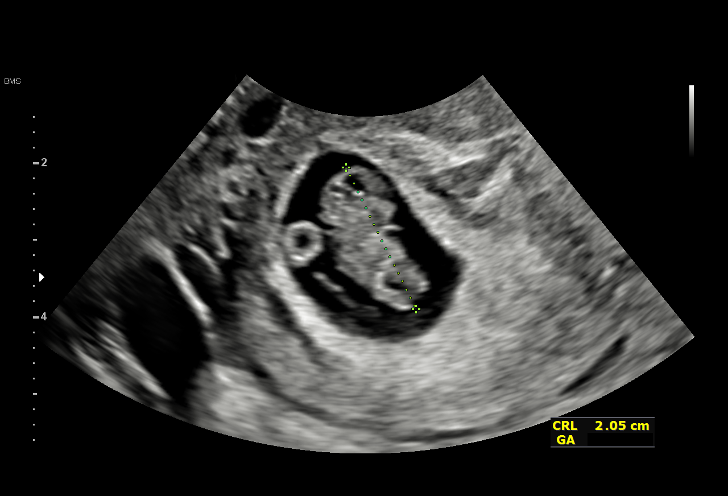
[im 42/75]
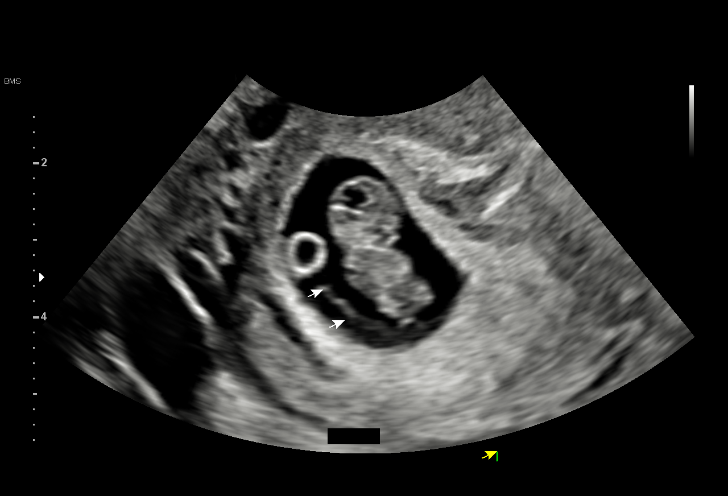
[im 47/75]
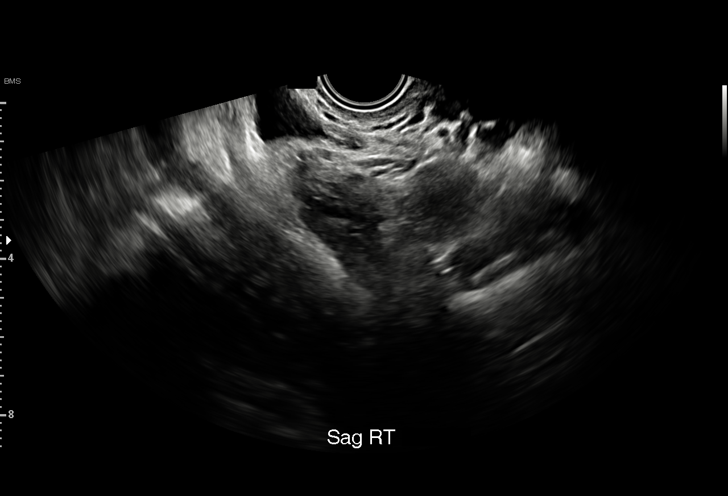
[im 53/75]
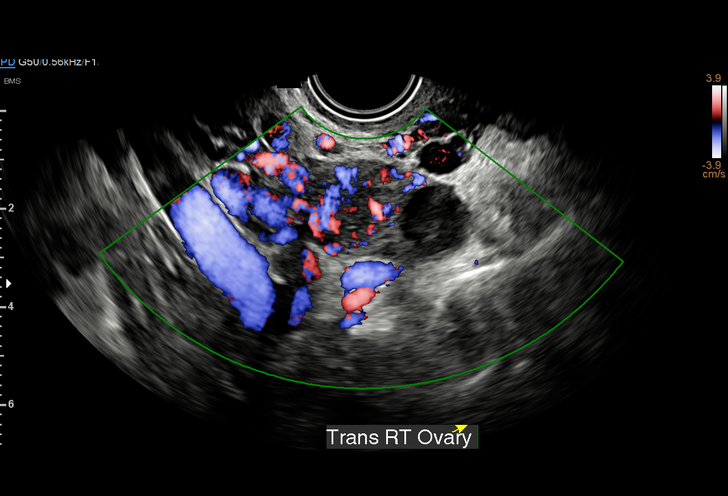
[im 58/75]
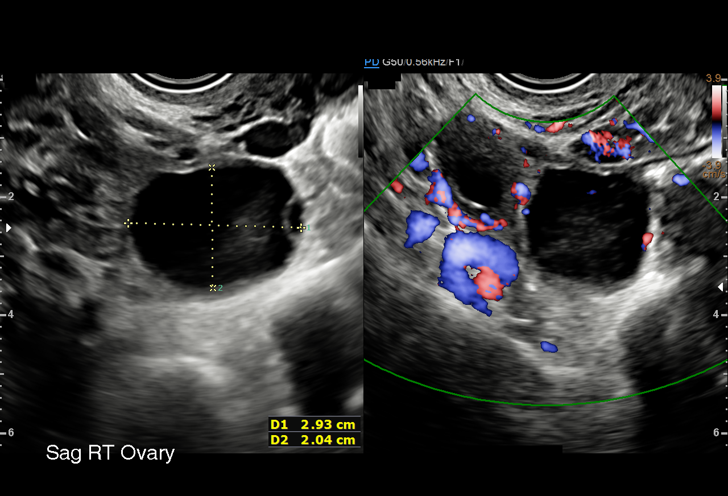
[im 64/75]
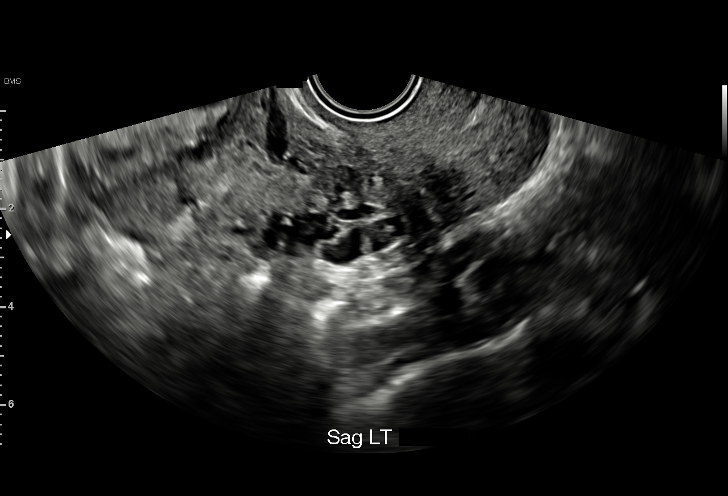
[im 69/75]
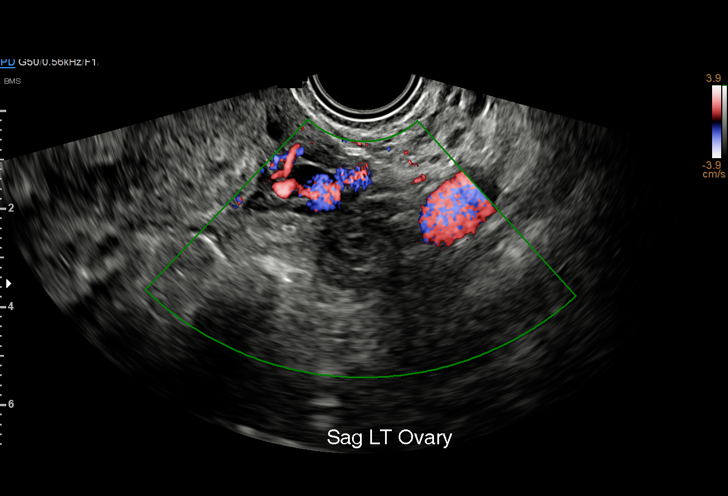
[im 75/75]
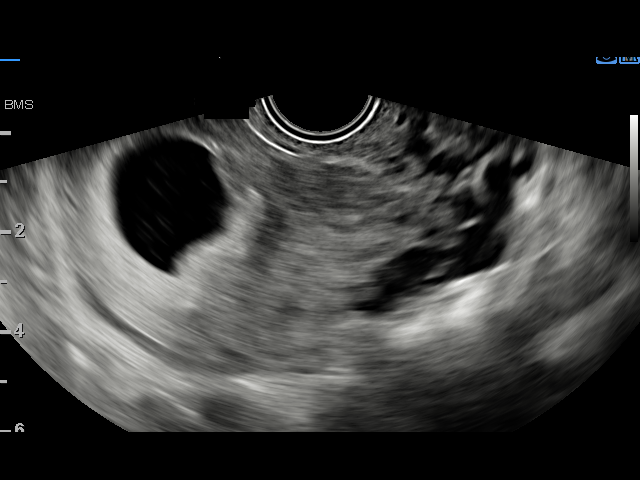

[15 of 28 positions shown; findings below may reference images not displayed]

FINDINGS: Intrauterine gestational sac: Single

Yolk sac:  Visualized.

Embryo:  Visualized.

Cardiac Activity: Visualized.

Heart Rate: 173 bpm

CRL:  20.7 mm   8 w   4 d                  US EDC: 12/08/2021

Subchorionic hemorrhage:  No convincing perigestational bleed.

Maternal uterus/adnexae: Anteverted uterus. No uterine fibroids.
Right ovary measures 4.2 x 2.6 x 3.5 cm and contains a corpus luteum
and a separate 2.9 x 2.0 x 2.1 cm cyst with no internal vascularity
and mild heterogeneous internal echoes, compatible with a
hemorrhagic cyst. Left ovary measures 1.5 x 1.2 x 1.9 cm. No
suspicious ovarian or adnexal masses. No abnormal free fluid in the
pelvis.
IMPRESSION: 1. Single living intrauterine gestation at 8 weeks 4 days by
crown-rump length, concordant with provided menstrual dating.
2. No acute first-trimester gestational abnormality.
3. Right ovarian 2.9 cm hemorrhagic cyst.
